# Patient Record
Sex: Male | Born: 1940
Health system: Southern US, Community
[De-identification: ages and names within clinical notes are randomized; demographics above are authoritative.]

## PROBLEM LIST (undated history)

## (undated) DIAGNOSIS — I499 Cardiac arrhythmia, unspecified: Secondary | ICD-10-CM

## (undated) DIAGNOSIS — N183 Chronic kidney disease, stage 3 unspecified: Secondary | ICD-10-CM

## (undated) DIAGNOSIS — R609 Edema, unspecified: Secondary | ICD-10-CM

## (undated) DIAGNOSIS — M170 Bilateral primary osteoarthritis of knee: Secondary | ICD-10-CM

## (undated) DIAGNOSIS — K5792 Diverticulitis of intestine, part unspecified, without perforation or abscess without bleeding: Secondary | ICD-10-CM

## (undated) DIAGNOSIS — E7800 Pure hypercholesterolemia, unspecified: Secondary | ICD-10-CM

## (undated) DIAGNOSIS — M1990 Unspecified osteoarthritis, unspecified site: Secondary | ICD-10-CM

## (undated) DIAGNOSIS — E291 Testicular hypofunction: Secondary | ICD-10-CM

## (undated) DIAGNOSIS — M109 Gout, unspecified: Secondary | ICD-10-CM

## (undated) DIAGNOSIS — R600 Localized edema: Secondary | ICD-10-CM

## (undated) DIAGNOSIS — I77810 Thoracic aortic ectasia: Secondary | ICD-10-CM

## (undated) DIAGNOSIS — E119 Type 2 diabetes mellitus without complications: Secondary | ICD-10-CM

## (undated) HISTORY — DX: Thoracic aortic ectasia: I77.810

## (undated) HISTORY — DX: Type 2 diabetes mellitus without complications: E11.9

## (undated) HISTORY — DX: Cardiac arrhythmia, unspecified: I49.9

## (undated) HISTORY — PX: KNEE ARTHROSCOPY: SUR90

## (undated) HISTORY — DX: Bilateral primary osteoarthritis of knee: M17.0

## (undated) HISTORY — DX: Chronic kidney disease, stage 3 unspecified: N18.30

## (undated) HISTORY — DX: Unspecified osteoarthritis, unspecified site: M19.90

## (undated) HISTORY — DX: Testicular hypofunction: E29.1

## (undated) HISTORY — PX: REPLACEMENT TOTAL KNEE: SUR1224

## (undated) HISTORY — DX: Pure hypercholesterolemia, unspecified: E78.00

---

## 2000-08-21 ENCOUNTER — Encounter: Payer: Self-pay | Admitting: Internal Medicine

## 2000-08-21 ENCOUNTER — Ambulatory Visit (HOSPITAL_COMMUNITY): Admission: RE | Admit: 2000-08-21 | Discharge: 2000-08-21 | Payer: Self-pay | Admitting: Internal Medicine

## 2004-10-10 ENCOUNTER — Ambulatory Visit (HOSPITAL_COMMUNITY): Admission: RE | Admit: 2004-10-10 | Discharge: 2004-10-10 | Payer: Self-pay | Admitting: Internal Medicine

## 2005-12-08 ENCOUNTER — Emergency Department (HOSPITAL_COMMUNITY): Admission: EM | Admit: 2005-12-08 | Discharge: 2005-12-08 | Payer: Self-pay | Admitting: Emergency Medicine

## 2006-01-22 ENCOUNTER — Ambulatory Visit (HOSPITAL_COMMUNITY): Admission: RE | Admit: 2006-01-22 | Discharge: 2006-01-22 | Payer: Self-pay | Admitting: Internal Medicine

## 2010-07-07 ENCOUNTER — Emergency Department (HOSPITAL_COMMUNITY)
Admission: EM | Admit: 2010-07-07 | Discharge: 2010-07-07 | Disposition: A | Discharge status: Home / Self Care | Payer: Medicare Other | Attending: Emergency Medicine | Admitting: Emergency Medicine

## 2010-07-07 ENCOUNTER — Emergency Department (HOSPITAL_COMMUNITY): Payer: Medicare Other

## 2010-07-07 DIAGNOSIS — R112 Nausea with vomiting, unspecified: Secondary | ICD-10-CM | POA: Insufficient documentation

## 2010-07-07 DIAGNOSIS — R109 Unspecified abdominal pain: Secondary | ICD-10-CM | POA: Insufficient documentation

## 2010-07-07 DIAGNOSIS — K5732 Diverticulitis of large intestine without perforation or abscess without bleeding: Secondary | ICD-10-CM | POA: Insufficient documentation

## 2010-07-07 DIAGNOSIS — M109 Gout, unspecified: Secondary | ICD-10-CM | POA: Insufficient documentation

## 2010-07-07 DIAGNOSIS — E7800 Pure hypercholesterolemia, unspecified: Secondary | ICD-10-CM | POA: Insufficient documentation

## 2010-07-07 LAB — DIFFERENTIAL
Basophils Absolute: 0 10*3/uL (ref 0.0–0.1)
Basophils Relative: 0 % (ref 0–1)
Eosinophils Absolute: 0 10*3/uL (ref 0.0–0.7)
Eosinophils Relative: 0 % (ref 0–5)
Lymphocytes Relative: 9 % — ABNORMAL LOW (ref 12–46)
Lymphs Abs: 1.4 10*3/uL (ref 0.7–4.0)
Monocytes Absolute: 1.5 10*3/uL — ABNORMAL HIGH (ref 0.1–1.0)
Monocytes Relative: 10 % (ref 3–12)
Neutro Abs: 11.9 10*3/uL — ABNORMAL HIGH (ref 1.7–7.7)
Neutrophils Relative %: 80 % — ABNORMAL HIGH (ref 43–77)

## 2010-07-07 LAB — CBC
HCT: 46.8 % (ref 39.0–52.0)
Hemoglobin: 15.9 g/dL (ref 13.0–17.0)
MCH: 29.9 pg (ref 26.0–34.0)
MCHC: 34 g/dL (ref 30.0–36.0)
MCV: 88.1 fL (ref 78.0–100.0)
Platelets: 173 10*3/uL (ref 150–400)
RBC: 5.31 MIL/uL (ref 4.22–5.81)
RDW: 14.3 % (ref 11.5–15.5)
WBC: 14.8 10*3/uL — ABNORMAL HIGH (ref 4.0–10.5)

## 2010-07-07 LAB — COMPREHENSIVE METABOLIC PANEL
AST: 18 U/L (ref 0–37)
BUN: 14 mg/dL (ref 6–23)
CO2: 25 mEq/L (ref 19–32)
Calcium: 9.3 mg/dL (ref 8.4–10.5)
Chloride: 104 mEq/L (ref 96–112)
Creatinine, Ser: 1.08 mg/dL (ref 0.4–1.5)
GFR calc Af Amer: >60 mL/min (ref >60)
GFR calc non Af Amer: >60 mL/min (ref >60)
Glucose, Bld: 123 mg/dL — ABNORMAL HIGH (ref 70–99)
Total Bilirubin: 1 mg/dL (ref 0.3–1.2)

## 2010-07-07 LAB — URINALYSIS, ROUTINE W REFLEX MICROSCOPIC
Bilirubin Urine: NEGATIVE
Ketones, ur: NEGATIVE mg/dL
Nitrite: NEGATIVE
Protein, ur: NEGATIVE mg/dL
Specific Gravity, Urine: 1.028 (ref 1.005–1.030)
Urobilinogen, UA: 0.2 mg/dL (ref 0.0–1.0)

## 2010-08-29 ENCOUNTER — Ambulatory Visit (HOSPITAL_COMMUNITY)
Admission: RE | Admit: 2010-08-29 | Discharge: 2010-08-29 | Disposition: A | Discharge status: Home / Self Care | Payer: Medicare Other | Source: Ambulatory Visit | Attending: Internal Medicine | Admitting: Internal Medicine

## 2010-08-29 DIAGNOSIS — M7989 Other specified soft tissue disorders: Secondary | ICD-10-CM | POA: Insufficient documentation

## 2010-08-29 DIAGNOSIS — M79609 Pain in unspecified limb: Secondary | ICD-10-CM | POA: Insufficient documentation

## 2012-03-28 ENCOUNTER — Ambulatory Visit (INDEPENDENT_AMBULATORY_CARE_PROVIDER_SITE_OTHER): Payer: Medicare Other | Admitting: Family Medicine

## 2012-03-28 VITALS — BP 150/79 | HR 81 | Temp 98.4°F | Resp 16 | Ht 66.0 in | Wt 226.6 lb

## 2012-03-28 DIAGNOSIS — J40 Bronchitis, not specified as acute or chronic: Secondary | ICD-10-CM

## 2012-03-28 DIAGNOSIS — R05 Cough: Secondary | ICD-10-CM

## 2012-03-28 MED ORDER — BENZONATATE 100 MG PO CAPS: 100.0000 mg | ORAL_CAPSULE | Freq: Three times a day (TID) | ORAL | Status: DC | PRN

## 2012-03-28 MED ORDER — AZITHROMYCIN 250 MG PO TABS: ORAL_TABLET | ORAL | Status: DC

## 2012-03-28 NOTE — Progress Notes (Signed)
Subjective:    Patient ID: Shawn [name], male    DOB: [birth year], 71 y.o.   MRN: [id number]  HPI Shawn [name] is a 71 y.o. male Cold symptoms around thanksgiving.  Persistent cough since then - past 2weeks.  No improvement in cough. Minimal phlegm with cough - yellow-clear at times. No underlying lung disease. Usually able to sleep through the night.   Tx: otc cough syrup.    Review of Systems  Constitutional: Negative for fever and chills.  HENT: Positive for congestion (on right. ).   Respiratory: Positive for cough. Negative for chest tightness and shortness of breath.   Cardiovascular: Negative for chest pain.       Objective:   Physical Exam  Nursing note and vitals reviewed. Constitutional: He is oriented to person, place, and time. He appears well-developed and well-nourished.  HENT:  Head: Normocephalic and atraumatic.  Right Ear: Tympanic membrane, external ear and ear canal normal.  Left Ear: Tympanic membrane, external ear and ear canal normal.  Nose: No rhinorrhea.  Mouth/Throat: Oropharynx is clear and moist and mucous membranes are normal. No oropharyngeal exudate or posterior oropharyngeal erythema.  Eyes: Conjunctivae normal are normal. Pupils are equal, round, and reactive to light.  Neck: Neck supple.  Cardiovascular: Normal rate, regular rhythm, normal heart sounds and intact distal pulses.   No murmur heard. Pulmonary/Chest: Effort normal and breath sounds normal. He has no wheezes. He has no rhonchi. He has no rales.       Clear, normal effort, few dry coughs in office.   Abdominal: Soft. There is no tenderness.  Lymphadenopathy:    He has no cervical adenopathy.  Neurological: He is alert and oriented to person, place, and time.  Skin: Skin is warm and dry. No rash noted.  Psychiatric: He has a normal mood and affect. His behavior is normal.      Assessment & Plan:  Shawn [name] is a 71 y.o. male 1. Cough    2. Bronchitis  benzonatate  (TESSALON) 100 MG capsule, azithromycin (ZITHROMAX) 250 MG tablet   Early bronchitis - reassuring exam.  Deferred bloodwork and xray at this point, but rtc precautions discussed. Sx care, z pak, tessalon prn. Watch BP and rtc if remains over 140/90 outside of office.   Patient Instructions  You likely have an early bronchitis.  Start the prescribed antibiotic, mucinex dm OR tessalon perles for the cough as prescribed and other information below. If not improving in next week, or cough lasts longer than 3 weeks - return for recheck. Your blood pressure was borderline high today. Keep a record of your blood pressures outside of the office and bring them to the next office visit - in next few weeks if remaining over 140/90.   Return to the clinic or go to the nearest emergency room if any of your symptoms worsen or new symptoms occur.  Bronchitis Bronchitis is the body's way of reacting to injury and/or infection (inflammation) of the bronchi. Bronchi are the air tubes that extend from the windpipe into the lungs. If the inflammation becomes severe, it may cause shortness of breath. CAUSES  Inflammation may be caused by:  A virus.  Germs (bacteria).  Dust.  Allergens.  Pollutants and many other irritants. The cells lining the bronchial tree are covered with tiny hairs (cilia). These constantly beat upward, away from the lungs, toward the mouth. This keeps the lungs free of pollutants. When these cells become too irritated and are unable to do their  job, mucus begins to develop. This causes the characteristic cough of bronchitis. The cough clears the lungs when the cilia are unable to do their job. Without either of these protective mechanisms, the mucus would settle in the lungs. Then you would develop pneumonia. Smoking is a common cause of bronchitis and can contribute to pneumonia. Stopping this habit is the single most important thing you can do to help yourself. TREATMENT   Your  caregiver may prescribe an antibiotic if the cough is caused by bacteria. Also, medicines that open up your airways make it easier to breathe. Your caregiver may also recommend or prescribe an expectorant. It will loosen the mucus to be coughed up. Only take over-the-counter or prescription medicines for pain, discomfort, or fever as directed by your caregiver.  Removing whatever causes the problem (smoking, for example) is critical to preventing the problem from getting worse.  Cough suppressants may be prescribed for relief of cough symptoms.  Inhaled medicines may be prescribed to help with symptoms now and to help prevent problems from returning.  For those with recurrent (chronic) bronchitis, there may be a need for steroid medicines. SEEK IMMEDIATE MEDICAL CARE IF:   During treatment, you develop more pus-like mucus (purulent sputum).  You have a fever.  Your baby is older than 3 months with a rectal temperature of 102 F (38.9 C) or higher.  Your baby is 57 months old or younger with a rectal temperature of 100.4 F (38 C) or higher.  You become progressively more ill.  You have increased difficulty breathing, wheezing, or shortness of breath. It is necessary to seek immediate medical care if you are elderly or sick from any other disease. MAKE SURE YOU:   Understand these instructions.  Will watch your condition.  Will get help right away if you are not doing well or get worse. Document Released: 04/03/2005 Document Revised: 06/26/2011 Document Reviewed: 02/11/2008 [facility] Patient Information 2013 [place], [place].

## 2012-03-28 NOTE — Patient Instructions (Signed)
You likely have an early bronchitis.  Start the prescribed antibiotic, mucinex dm OR tessalon perles for the cough as prescribed and other information below. If not improving in next week, or cough lasts longer than 3 weeks - return for recheck. Your blood pressure was borderline high today. Keep a record of your blood pressures outside of the office and bring them to the next office visit - in next few weeks if remaining over 140/90.   Return to the clinic or go to the nearest emergency room if any of your symptoms worsen or new symptoms occur.  Bronchitis Bronchitis is the body's way of reacting to injury and/or infection (inflammation) of the bronchi. Bronchi are the air tubes that extend from the windpipe into the lungs. If the inflammation becomes severe, it may cause shortness of breath. CAUSES  Inflammation may be caused by:  A virus.  Germs (bacteria).  Dust.  Allergens.  Pollutants and many other irritants. The cells lining the bronchial tree are covered with tiny hairs (cilia). These constantly beat upward, away from the lungs, toward the mouth. This keeps the lungs free of pollutants. When these cells become too irritated and are unable to do their job, mucus begins to develop. This causes the characteristic cough of bronchitis. The cough clears the lungs when the cilia are unable to do their job. Without either of these protective mechanisms, the mucus would settle in the lungs. Then you would develop pneumonia. Smoking is a common cause of bronchitis and can contribute to pneumonia. Stopping this habit is the single most important thing you can do to help yourself. TREATMENT   Your caregiver may prescribe an antibiotic if the cough is caused by bacteria. Also, medicines that open up your airways make it easier to breathe. Your caregiver may also recommend or prescribe an expectorant. It will loosen the mucus to be coughed up. Only take over-the-counter or prescription medicines  for pain, discomfort, or fever as directed by your caregiver.  Removing whatever causes the problem (smoking, for example) is critical to preventing the problem from getting worse.  Cough suppressants may be prescribed for relief of cough symptoms.  Inhaled medicines may be prescribed to help with symptoms now and to help prevent problems from returning.  For those with recurrent (chronic) bronchitis, there may be a need for steroid medicines. SEEK IMMEDIATE MEDICAL CARE IF:   During treatment, you develop more pus-like mucus (purulent sputum).  You have a fever.  Your baby is older than 3 months with a rectal temperature of 102 F (38.9 C) or higher.  Your baby is 20 months old or younger with a rectal temperature of 100.4 F (38 C) or higher.  You become progressively more ill.  You have increased difficulty breathing, wheezing, or shortness of breath. It is necessary to seek immediate medical care if you are elderly or sick from any other disease. MAKE SURE YOU:   Understand these instructions.  Will watch your condition.  Will get help right away if you are not doing well or get worse. Document Released: 04/03/2005 Document Revised: 06/26/2011 Document Reviewed: 02/11/2008 [facility] Patient Information 2013 [place], [place].

## 2012-09-26 ENCOUNTER — Emergency Department (HOSPITAL_COMMUNITY): Payer: Medicare Other

## 2012-09-26 ENCOUNTER — Encounter (HOSPITAL_COMMUNITY): Payer: Self-pay | Admitting: *Deleted

## 2012-09-26 ENCOUNTER — Emergency Department (HOSPITAL_COMMUNITY)
Admission: EM | Admit: 2012-09-26 | Discharge: 2012-09-26 | Disposition: A | Discharge status: Home / Self Care | Payer: Medicare Other | Attending: Emergency Medicine | Admitting: Emergency Medicine

## 2012-09-26 ENCOUNTER — Emergency Department (HOSPITAL_COMMUNITY)
Admission: EM | Admit: 2012-09-26 | Discharge: 2012-09-26 | Disposition: A | Discharge status: Other Acute Inpatient Hospital | Payer: Medicare Other | Source: Home / Self Care | Attending: Emergency Medicine | Admitting: Emergency Medicine

## 2012-09-26 DIAGNOSIS — R197 Diarrhea, unspecified: Secondary | ICD-10-CM | POA: Insufficient documentation

## 2012-09-26 DIAGNOSIS — E669 Obesity, unspecified: Secondary | ICD-10-CM | POA: Insufficient documentation

## 2012-09-26 DIAGNOSIS — M109 Gout, unspecified: Secondary | ICD-10-CM | POA: Insufficient documentation

## 2012-09-26 DIAGNOSIS — Z8739 Personal history of other diseases of the musculoskeletal system and connective tissue: Secondary | ICD-10-CM | POA: Insufficient documentation

## 2012-09-26 DIAGNOSIS — K5792 Diverticulitis of intestine, part unspecified, without perforation or abscess without bleeding: Secondary | ICD-10-CM

## 2012-09-26 DIAGNOSIS — Z79899 Other long term (current) drug therapy: Secondary | ICD-10-CM | POA: Insufficient documentation

## 2012-09-26 DIAGNOSIS — E785 Hyperlipidemia, unspecified: Secondary | ICD-10-CM | POA: Insufficient documentation

## 2012-09-26 DIAGNOSIS — K5732 Diverticulitis of large intestine without perforation or abscess without bleeding: Secondary | ICD-10-CM

## 2012-09-26 HISTORY — DX: Diverticulitis of intestine, part unspecified, without perforation or abscess without bleeding: K57.92

## 2012-09-26 LAB — CBC WITH DIFFERENTIAL/PLATELET
Basophils Absolute: 0.1 10*3/uL (ref 0.0–0.1)
HCT: 44.7 % (ref 39.0–52.0)
Lymphocytes Relative: 17 % (ref 12–46)
Lymphs Abs: 2.4 10*3/uL (ref 0.7–4.0)
MCV: 87.6 fL (ref 78.0–100.0)
Monocytes Absolute: 1.2 10*3/uL — ABNORMAL HIGH (ref 0.1–1.0)
Neutro Abs: 10.4 10*3/uL — ABNORMAL HIGH (ref 1.7–7.7)
Platelets: 179 10*3/uL (ref 150–400)
RBC: 5.1 MIL/uL (ref 4.22–5.81)
RDW: 15.2 % (ref 11.5–15.5)
WBC: 14.1 10*3/uL — ABNORMAL HIGH (ref 4.0–10.5)

## 2012-09-26 LAB — COMPREHENSIVE METABOLIC PANEL
ALT: 11 U/L (ref 0–53)
AST: 15 U/L (ref 0–37)
CO2: 28 mEq/L (ref 19–32)
Chloride: 98 mEq/L (ref 96–112)
GFR calc Af Amer: 60 mL/min — ABNORMAL LOW (ref >90)
GFR calc non Af Amer: 52 mL/min — ABNORMAL LOW (ref >90)
Glucose, Bld: 106 mg/dL — ABNORMAL HIGH (ref 70–99)
Sodium: 137 mEq/L (ref 135–145)
Total Bilirubin: 1 mg/dL (ref 0.3–1.2)

## 2012-09-26 LAB — POCT URINALYSIS DIP (DEVICE)
Glucose, UA: NEGATIVE mg/dL
Ketones, ur: NEGATIVE mg/dL
Leukocytes, UA: NEGATIVE
Protein, ur: 30 mg/dL — AB
Specific Gravity, Urine: >=1.03 (ref 1.005–1.030)

## 2012-09-26 MED ORDER — IOHEXOL 300 MG/ML  SOLN
100.0000 mL | Freq: Once | INTRAMUSCULAR | Status: AC | PRN
  Administered 2012-09-26: 100 mL via INTRAVENOUS

## 2012-09-26 MED ORDER — MORPHINE SULFATE 4 MG/ML IJ SOLN
4.0000 mg | Freq: Once | INTRAMUSCULAR | Status: AC
  Administered 2012-09-26: 4 mg via INTRAVENOUS
  Filled 2012-09-26: qty 1

## 2012-09-26 MED ORDER — METRONIDAZOLE 500 MG PO TABS
500.0000 mg | ORAL_TABLET | Freq: Once | ORAL | Status: AC
  Administered 2012-09-26: 500 mg via ORAL
  Filled 2012-09-26: qty 1

## 2012-09-26 MED ORDER — ONDANSETRON HCL 4 MG/2ML IJ SOLN
4.0000 mg | Freq: Once | INTRAMUSCULAR | Status: AC
  Administered 2012-09-26: 4 mg via INTRAMUSCULAR

## 2012-09-26 MED ORDER — HYDROMORPHONE HCL PF 1 MG/ML IJ SOLN
1.0000 mg | Freq: Once | INTRAMUSCULAR | Status: AC
  Administered 2012-09-26: 1 mg via INTRAMUSCULAR

## 2012-09-26 MED ORDER — METRONIDAZOLE 500 MG PO TABS: 500.0000 mg | ORAL_TABLET | Freq: Three times a day (TID) | ORAL | Status: DC

## 2012-09-26 MED ORDER — CIPROFLOXACIN IN D5W 400 MG/200ML IV SOLN
400.0000 mg | Freq: Once | INTRAVENOUS | Status: AC
  Administered 2012-09-26: 400 mg via INTRAVENOUS
  Filled 2012-09-26: qty 200

## 2012-09-26 MED ORDER — ONDANSETRON HCL 4 MG/2ML IJ SOLN
INTRAMUSCULAR | Status: AC
  Filled 2012-09-26: qty 2

## 2012-09-26 MED ORDER — IOHEXOL 300 MG/ML  SOLN
25.0000 mL | INTRAMUSCULAR | Status: AC
  Administered 2012-09-26: 25 mL via ORAL

## 2012-09-26 MED ORDER — CIPROFLOXACIN HCL 500 MG PO TABS: 500.0000 mg | ORAL_TABLET | Freq: Two times a day (BID) | ORAL | Status: DC

## 2012-09-26 MED ORDER — HYDROMORPHONE HCL PF 1 MG/ML IJ SOLN
INTRAMUSCULAR | Status: AC
  Filled 2012-09-26: qty 1

## 2012-09-26 MED ORDER — HYDROCODONE-ACETAMINOPHEN 5-325 MG PO TABS: 1.0000 | ORAL_TABLET | ORAL | Status: DC | PRN

## 2012-09-26 NOTE — ED Notes (Signed)
Durene Cal, MD at bedside

## 2012-09-26 NOTE — ED Provider Notes (Signed)
Chief Complaint:   Chief Complaint  Patient presents with  . Abdominal Pain    History of Present Illness:    Shawn [name] is a 72 year old male who has had a two-day history of increasingly severe left lower quadrant pain without radiation and is rated a 9/10 at the worst and then sometimes it subsides to a 6/10. Nothing makes it better or worse. It's been associated with a few loose stools without blood or mucus, fever, chills, sweats, and nausea. He denies any vomiting or urinary symptoms. There's been no blood in the urine or stool. He has a history of diverticulitis in the past.  Review of Systems:  Other than noted above, the patient denies any of the following symptoms: Constitutional:  No fever, chills, fatigue, weight loss or anorexia. Lungs:  No cough or shortness of breath. Heart:  No chest pain, palpitations, syncope or edema.  No cardiac history. Abdomen:  No nausea, vomiting, hematememesis, melena, diarrhea, or hematochezia. GU:  No dysuria, frequency, urgency, or hematuria.  No testicular pain or swelling.  PMFSH:  Past medical history, family history, social history, meds, and allergies were reviewed along with nurse's notes.  No prior abdominal surgeries or history of GI problems.  No use of NSAIDs or aspirin.  No excessive  alcohol intake. He takes allopurinol, AndroGel, and Crestor. He has a history of gout and elevated cholesterol.  Physical Exam:   Vital signs:  BP 160/80  Pulse 78  Temp(Src) 101.4 F (38.6 C) (Oral)  Resp 18  SpO2 98% Gen:  Alert, oriented, in no distress. Lungs:  Breath sounds clear and equal bilaterally.  No wheezes, rales or rhonchi. Heart:  Regular rhythm.  No gallops or murmers.   Abdomen:  Soft and mildly distended. Bowel sounds are hyperactive. There is tenderness with guarding and rebound in the left lower quadrant. No tenderness elsewhere in the abdomen. No organomegaly or mass. Skin:  Clear, warm and dry.  No rash.  Labs:   Results  for orders placed during the hospital encounter of 09/26/12  POCT URINALYSIS DIP (DEVICE)      Result Value Range   Glucose, UA NEGATIVE  NEGATIVE mg/dL   Bilirubin Urine NEGATIVE  NEGATIVE   Ketones, ur NEGATIVE  NEGATIVE mg/dL   Specific Gravity, Urine >=1.030  1.005 - 1.030   Hgb urine dipstick MODERATE (*) NEGATIVE   pH 5.5  5.0 - 8.0   Protein, ur 30 (*) NEGATIVE mg/dL   Urobilinogen, UA 0.2  0.0 - 1.0 mg/dL   Nitrite NEGATIVE  NEGATIVE   Leukocytes, UA NEGATIVE  NEGATIVE    Course in Urgent Care Center:   Given Dilaudid 1 mg IM and Zofran 4 mg IM.  Assessment:  The encounter diagnosis was Diverticulitis.  He will need further evaluation including a CT scan and if he does have diverticulitis will need his first dose of antibiotics intravenously.  Plan:   1.  The following meds were prescribed:   New Prescriptions   No medications on file   2.  The patient was transferred to the emergency department in stable condition via shuttle.  Medical Decision Making:  72 year old male has 2 day history of LLQ pain, nausea, loose stools, fever.  No vomiting or urinary Sx.  Has a Hx of diverticulitis.  On exam he is tender w/ guarding in LLQ.  BS are active.  I think he has diverticulitis--needs CT scan and perhaps first dose ov IV antibiotics. We will give IM  Dilaudid 1 mg and Zofran 4 mg.      Reuben Likes, MD 09/26/12 [id number]

## 2012-09-26 NOTE — ED Provider Notes (Signed)
History     CSN: [id number] Arrival date & time 09/26/12  1457 First MD Initiated Contact with Patient 09/26/12 1727      Chief Complaint  Patient presents with  . Abdominal Pain   Patient is a 72 y.o. male presenting with abdominal pain.  Abdominal Pain Associated symptoms include abdominal pain.    72 year old male with history of diverticulitis presenting with LLQ pain.   Patient describes pain starting yesterday morning in LLQ crampy in nature. No radiation of pain. Pain fluctuates but goes up to 8-9/10 at peak but never better than 6/10. No nausea/vomiting/fever/chills (before coming to urgent care and was found to have temp to 101.3 there). Associated symptoms include 1-2 loose stools daily over the last 2 days. Denies blood in stool or melena. No dysuria or polyuria. Patient has been able to tolerate PO. Denies any worsening or alleviating factors. Denies weakness in legs or numbness tingling. Dneies back pain.   At urgent care, high concern for divericulitis so sent here for CT scan. Given 1mg  of dilaudid as well as Zofran before transport.   Past Medical History  Diagnosis Date  . Arthritis   . Diverticulitis   Gout Hyperlipidemia  History reviewed. No pertinent past surgical history.  Family History  Problem Relation Age of Onset  . Cancer Father     History  Substance Use Topics  . Smoking status: Never Smoker   . Smokeless tobacco: Not on file  . Alcohol Use: Yes    Review of Systems  Gastrointestinal: Positive for abdominal pain.   A full 10 point review of symptoms was performed and was negative except as noted in HPI.   Allergies  Review of patient's allergies indicates no known allergies.  Home Medications   Current Outpatient Rx  Name  Route  Sig  Dispense  Refill  . allopurinol (ZYLOPRIM) 300 MG tablet   Oral   Take 150 mg by mouth daily.          . fish oil-omega-3 fatty acids 1000 MG capsule   Oral   Take 1 g by mouth daily.          . Glucosamine HCl (GLUCOSAMINE PO)   Oral   Take 1 tablet by mouth daily.         . Multiple Vitamin (MULTIVITAMIN WITH MINERALS) TABS   Oral   Take 1 tablet by mouth daily.         . rosuvastatin (CRESTOR) 10 MG tablet   Oral   Take 10 mg by mouth every morning.          . testosterone (ANDROGEL) 50 MG/5GM GEL   Transdermal   Place 5 g onto the skin daily. Applies to shoulders and upper arms           BP 126/73  Pulse 87  Temp(Src) 99 F (37.2 C) (Oral)  Resp 16  SpO2 94%  Physical Exam  Constitutional: He is oriented to person, place, and time. He appears well-developed and well-nourished. No distress.  obese  HENT:  Head: Normocephalic and atraumatic.  Eyes: EOM are normal. Pupils are equal, round, and reactive to light.  Neck: Normal range of motion. Neck supple.  Cardiovascular: Normal rate and regular rhythm.  Exam reveals no gallop and no friction rub.   No murmur heard. Pulmonary/Chest: Effort normal and breath sounds normal. He has no wheezes. He has no rales.  Abdominal: Soft. Bowel sounds are normal. He exhibits no mass. There  is tenderness (moderate in LLQ (after dilaudid) ). There is no rebound and no guarding.  Musculoskeletal: Normal range of motion. He exhibits no edema.  Neurological: He is alert and oriented to person, place, and time.  Skin: Skin is warm and dry.  Psychiatric: He has a normal mood and affect. His behavior is normal.    ED Course  Procedures (including critical care time)  Labs Reviewed  CBC WITH DIFFERENTIAL - Abnormal; Notable for the following:    WBC 14.1 (*)    Neutro Abs 10.4 (*)    Monocytes Absolute 1.2 (*)    All other components within normal limits  COMPREHENSIVE METABOLIC PANEL - Abnormal; Notable for the following:    Glucose, Bld 106 (*)    GFR calc non Af Amer 52 (*)    GFR calc Af Amer 60 (*)    All other components within normal limits  LIPASE, BLOOD - Abnormal; Notable for the following:    Lipase  157 (*)    All other components within normal limits   Ct Abdomen Pelvis W Contrast  09/26/2012   *RADIOLOGY REPORT*  Clinical Data: Right lower quadrant abdominal pain, nausea and diarrhea.  History of diverticulitis.  CT ABDOMEN AND PELVIS WITH CONTRAST  Technique:  Multidetector CT imaging of the abdomen and pelvis was performed following the standard protocol during bolus administration of intravenous contrast.  Contrast: OMNIPAQUE IOHEXOL 300 MG/ML  SOLN  Comparison: 07/07/2010.  Findings: Large number of colonic diverticula, most numerous in the sigmoid region.  Significant eccentric sigmoid colon low density wall thickening with adjacent pericolonic soft tissue stranding and edema.  Normal appendix.  No small bowel abnormalities and no fluid collections or free peritoneal air.  Diffuse low density of the liver relative to the spleen.  Normal appearing spleen, pancreas, gallbladder, adrenal glands, kidneys and urinary bladder.  Moderately enlarged prostate gland.  Small bilateral inguinal hernias containing fat.  No enlarged lymph nodes.  Mildly increased linear density at both lung bases.  Lumbar lower thoracic spine degenerative changes.  IMPRESSION:  1.  Sigmoid diverticulitis without abscess. 2.  Extensive colonic diverticulosis. 3.  Diffuse hepatic steatosis. 4.  Moderately enlarged prostate gland. 5.  Small bilateral inguinal hernias containing fat. 6.  Bibasilar linear atelectasis and scarring.   Original Report Authenticated By: Beckie Salts, M.D.     1. Diverticulitis       MDM  72 year old male with history of diverticulitis presenting with LLQ pain found to have sigmoid diverticulitis on CT scan and WBC 14k. Otherwise, patient nontoxic appearing and pain moderately well controlled after dilaudid.  Patient given IV cipro and po flagyl before discharge without difficulty. Given 14 days of antibiotics and instructed to follow up with PCP on Friday. If patient doing well in 10 days,  PCP could shorten duration.   Lipase of 157 noted and will need follow up. Advised cessation of alcohol. Will need repeat testing. No epigastric pain to account for this from pancreatitis perspective and tolerating PO.   Shelva Majestic, MD 09/26/12 2006

## 2012-09-26 NOTE — ED Notes (Signed)
Pt finished oral contrast.  CT notified.

## 2012-09-26 NOTE — ED Notes (Addendum)
Pt in from urgent care for eval for abd pain with fever, r/o diverticulitis, pt states he has been c/o abd pain with with nausea since yesterday, given shot of pain medication at urgent care which reduced pain to 4/10

## 2012-09-26 NOTE — ED Notes (Signed)
Pt  Reports  Low    abd  Pain l  Lower  Quad  Since  yest   Nausea  No  Vomiting              -   History  Of  divericulitis

## 2012-09-27 NOTE — ED Provider Notes (Signed)
I saw and evaluated the patient, reviewed the resident's note and I agree with the findings and plan.   .Face to face Exam:  General:  Awake HEENT:  Atraumatic Resp:  Normal effort Abd:  Nondistended Neuro:No focal weakness   Nelia Shi, MD 09/27/12 1048

## 2014-01-26 ENCOUNTER — Encounter (HOSPITAL_BASED_OUTPATIENT_CLINIC_OR_DEPARTMENT_OTHER): Payer: Medicare Other

## 2014-02-03 ENCOUNTER — Ambulatory Visit (HOSPITAL_BASED_OUTPATIENT_CLINIC_OR_DEPARTMENT_OTHER): Payer: Medicare Other | Attending: Internal Medicine | Admitting: Radiology

## 2014-02-03 VITALS — Ht 66.0 in | Wt 225.0 lb

## 2014-02-03 DIAGNOSIS — G4733 Obstructive sleep apnea (adult) (pediatric): Secondary | ICD-10-CM | POA: Insufficient documentation

## 2014-02-21 DIAGNOSIS — G4733 Obstructive sleep apnea (adult) (pediatric): Secondary | ICD-10-CM

## 2014-02-21 NOTE — Sleep Study (Signed)
   NAME: Shawn CrockerRichard Economos DATE OF BIRTH:  February 12, 1941 MEDICAL RECORD NUMBER [id number]  LOCATION: [facility]  PHYSICIAN: YOUNG,CLINTON D  DATE OF STUDY: 02/03/2014  SLEEP STUDY TYPE: Nocturnal Polysomnogram               REFERRING PHYSICIAN: Laurena Slimmerlark, Preston S, MD  INDICATION FOR STUDY: Insomnia with sleep apnea  EPWORTH SLEEPINESS SCORE:  15/ 24 HEIGHT: 5\' 6"  (167.6 cm)  WEIGHT: 225 lb (102.059 kg)    Body mass index is 36.33 kg/(m^2).  NECK SIZE: 15.5 in.  MEDICATIONS: Charted for review   SLEEP ARCHITECTURE: Total sleep time 340 min, sleep efficiency 91.2%, Stage 1 9.6%, Stage 2 64.6%, Stage 3 absent, REM 25.9% of total sleep time. Sleep latency 8.0 min, REM latency 55.5 min, Awake after sleep onset 25.805min/ arousal index 17.5, Bedtime medication: none  RESPIRATORY DATA: Apnea/ Hypopnea Index (AHI) 15.0/ hr. 85 total events scored, 8 obstructive apneas, 77 hypopneas. All events were non-supine. This study was ordered as a diagnostic polysomnogram (NPSG) without CPAP.  OXYGEN DATA: Moderately loud snoring with O2 desaturation to a nadir of 86% and mean O2 saturation 93.1% on room air.  CARDIAC DATA: Sinus rhythm with PACs and PVCs.  MOVEMENT/PARASOMNIA: A few incidental limb jerks were noted with little associated sleep disturbance. No bathroom trips  IMPRESSION/ RECOMMENDATION:   1) Mild to moderate obstructive sleep apnea/ hypopnea syndrome, AHI 15.0/ hr with non-supine events. Moderately loud snoring with oxygen desaturation to a nadir of 86% and mean O2 saturation of 93.1% on room air. 2) CPAP titration was not requested with this study. The patient can return for a dedicated CPAP titration study if appropriate.   Waymon BudgeYOUNG,CLINTON D Diplomate, American Board of Sleep Medicine  ELECTRONICALLY SIGNED ON:  02/21/2014, 9:59 AM [facility] PH: [phone]   FX: [phone] ACCREDITED BY THE AMERICAN ACADEMY OF SLEEP MEDICINE

## 2014-04-28 ENCOUNTER — Encounter: Payer: Self-pay | Admitting: Pulmonary Disease

## 2014-04-28 ENCOUNTER — Ambulatory Visit (INDEPENDENT_AMBULATORY_CARE_PROVIDER_SITE_OTHER): Payer: Medicare Other | Admitting: Pulmonary Disease

## 2014-04-28 VITALS — BP 124/76 | HR 87 | Temp 98.1°F | Ht 66.0 in | Wt 229.0 lb

## 2014-04-28 DIAGNOSIS — G4733 Obstructive sleep apnea (adult) (pediatric): Secondary | ICD-10-CM

## 2014-04-28 NOTE — Assessment & Plan Note (Signed)
The patient has mild obstructive sleep apnea by his recent sleep study, and I have reviewed the pathophysiology of sleep disordered breathing with him.  The good news here is that his degree of sleep apnea is not significant enough to have a big impact on his cardiovascular health, but he understands this can worsen over time if he does not lose weight. I have outlined a conservative treatment approach with a trial of weight loss over the next 6 months to see if he can make headway. He can also consider a dental appliance or C Pap while trying to work on weight loss. His decision should be based upon the impact of his sleep disordered breathing to he and his wife's sleep. After a long discussion, the patient would like to try a dental appliance while working on weight loss. I have offered to refer him to an orthodontist here in town who does appliances as a specialty, but he would like to discuss further with his dentist. He understands that he is to call me if he changes his mind and wishes to try C Pap.

## 2014-04-28 NOTE — Progress Notes (Signed)
Subjective:    Patient ID: Shawn [name], male    DOB: [birth year], 74 y.o.   MRN: [id number]  HPI The patient is a 74 year old male who I've been asked to see for management of obstructive sleep apnea. He has had a recent sleep study that showed an AHI of 15 events per hour. He has been noted to have significant snoring, but his bed partner has never commented on an abnormal breathing pattern during sleep. He does have frequent awakenings during the night, but feels that he is rested 75% of the mornings. He does note occasional sleepiness during the day with reading, but otherwise feels that he has a very good alertness level. He also has some dozing in the evenings watching television or movies. He has mild sleepiness driving longer distances, but otherwise has no issues. He does report that his wife did note him dozing at a stoplight on one occasion a while back. The patient states that his weight is up about 5 pounds over the last 2 years, and his Epworth score today is 8. He tells me that both his father and brother have C Pap devices.   Sleep Questionnaire What time do you typically go to bed?( Between what hours) 11:30P 11:30P at 1451 on 04/28/14 by Tommie SamsMindy S Silva, CMA How long does it take you to fall asleep? 15-20 min 15-20 min at 1451 on 04/28/14 by Tommie SamsMindy S Silva, CMA How many times during the night do you wake up? 2 2 at 1451 on 04/28/14 by Tommie SamsMindy S Silva, CMA What time do you get out of bed to start your day? 0630 0630 at 1451 on 04/28/14 by Tommie SamsMindy S Silva, CMA Do you drive or operate heavy machinery in your occupation? No No at 1451 on 04/28/14 by Tommie SamsMindy S Silva, CMA How much has your weight changed (up or down) over the past two years? (In pounds) 5 lb (2.268 kg) 5 lb (2.268 kg) at 1451 on 04/28/14 by Tommie SamsMindy S Silva, CMA Have you ever had a sleep study before? Yes Yes at 1451 on 04/28/14 by Tommie SamsMindy S Silva, CMA If yes, [facility] at 1451 on 04/28/14 by Tommie SamsMindy  S Silva, CMA If yes, date of study? 01/2014 01/2014 at 1451 on 04/28/14 by Tommie SamsMindy S Silva, CMA Do you currently use CPAP? No No at 1451 on 04/28/14 by Tommie SamsMindy S Silva, CMA Do you wear oxygen at any time? No No at 1451 on 04/28/14 by Tommie SamsMindy S Silva, CMA   Review of Systems  Constitutional: Negative for fever and unexpected weight change.  HENT: Negative for congestion, dental problem, ear pain, nosebleeds, postnasal drip, rhinorrhea, sinus pressure, sneezing, sore throat and trouble swallowing.   Eyes: Negative for redness and itching.  Respiratory: Negative for cough, chest tightness, shortness of breath and wheezing.   Cardiovascular: Negative for palpitations and leg swelling.  Gastrointestinal: Negative for nausea and vomiting.  Genitourinary: Negative for dysuria.  Musculoskeletal: Negative for joint swelling.  Skin: Negative for rash.  Neurological: Negative for headaches.  Hematological: Does not bruise/bleed easily.  Psychiatric/Behavioral: Negative for dysphoric mood. The patient is not nervous/anxious.        Objective:   Physical Exam Constitutional:  Overweight male, no acute distress  HENT:  Nares patent without discharge  Oropharynx without exudate, palate and uvula are mildly elongated  Eyes:  Perrla, eomi, no scleral icterus  Neck:  No JVD, no TMG  Cardiovascular:  Normal rate, regular rhythm, no rubs or gallops.  2/6 sem        Intact distal pulses  Pulmonary :  Normal breath sounds, no stridor or respiratory distress   No rales, rhonchi, or wheezing  Abdominal:  Soft, nondistended, bowel sounds present.  No tenderness noted.   Musculoskeletal:  1+ lower extremity edema noted.  Lymph Nodes:  No cervical lymphadenopathy noted  Skin:  No cyanosis noted  Neurologic:  Alert, appropriate, moves all 4 extremities without obvious deficit.         Assessment & Plan:

## 2014-04-28 NOTE — Patient Instructions (Signed)
Please contact your dentist to see if they fit dental appliances for sleep apnea.  If not, I would be happy to refer you to someone in town who is very good with them.   Work on weight loss If you change your mind and would like to try cpap, let me know.

## 2015-06-02 ENCOUNTER — Other Ambulatory Visit: Payer: Self-pay | Admitting: Internal Medicine

## 2015-06-02 DIAGNOSIS — R109 Unspecified abdominal pain: Secondary | ICD-10-CM

## 2015-06-07 ENCOUNTER — Ambulatory Visit (HOSPITAL_COMMUNITY)
Admission: RE | Admit: 2015-06-07 | Discharge: 2015-06-07 | Disposition: A | Discharge status: Home / Self Care | Payer: Medicare Other | Source: Ambulatory Visit | Attending: Internal Medicine | Admitting: Internal Medicine

## 2015-06-07 DIAGNOSIS — R109 Unspecified abdominal pain: Secondary | ICD-10-CM | POA: Diagnosis present

## 2015-07-27 ENCOUNTER — Other Ambulatory Visit: Payer: Self-pay | Admitting: Emergency Medicine

## 2015-07-27 ENCOUNTER — Ambulatory Visit (INDEPENDENT_AMBULATORY_CARE_PROVIDER_SITE_OTHER): Payer: Medicare Other

## 2015-07-27 ENCOUNTER — Ambulatory Visit (INDEPENDENT_AMBULATORY_CARE_PROVIDER_SITE_OTHER): Payer: Medicare Other | Admitting: Emergency Medicine

## 2015-07-27 VITALS — BP 138/82 | HR 81 | Temp 98.7°F | Resp 16 | Ht 66.0 in | Wt 230.6 lb

## 2015-07-27 DIAGNOSIS — R05 Cough: Secondary | ICD-10-CM

## 2015-07-27 DIAGNOSIS — M7989 Other specified soft tissue disorders: Secondary | ICD-10-CM

## 2015-07-27 DIAGNOSIS — R059 Cough, unspecified: Secondary | ICD-10-CM

## 2015-07-27 LAB — POCT CBC
Granulocyte percent: 60.8 %G (ref 37–80)
HCT, POC: 41.2 % — AB (ref 43.5–53.7)
HEMOGLOBIN: 14.7 g/dL (ref 14.1–18.1)
Lymph, poc: 1.7 (ref 0.6–3.4)
MCH: 30.7 pg (ref 27–31.2)
MCHC: 35.7 g/dL — AB (ref 31.8–35.4)
MCV: 86.1 fL (ref 80–97)
MID (cbc): 0.7 (ref 0–0.9)
MPV: 6.7 fL (ref 0–99.8)
PLATELET COUNT, POC: 193 10*3/uL (ref 142–424)
POC Granulocyte: 3.8 (ref 2–6.9)
POC LYMPH PERCENT: 27.6 %L (ref 10–50)
POC MID %: 11.6 % (ref 0–12)
RBC: 4.79 M/uL (ref 4.69–6.13)
RDW, POC: 15.3 %
WBC: 6.2 10*3/uL (ref 4.6–10.2)

## 2015-07-27 MED ORDER — AZITHROMYCIN 250 MG PO TABS: ORAL_TABLET | ORAL | Status: DC

## 2015-07-27 MED ORDER — BENZONATATE 100 MG PO CAPS: 100.0000 mg | ORAL_CAPSULE | Freq: Three times a day (TID) | ORAL | Status: DC | PRN

## 2015-07-27 NOTE — Patient Instructions (Addendum)
We made appointment for you to have Venous Doppler U/S tomorrow, 07/28/15 @ 3:30pm.  [facility] [address]  [phone]    IF you received an x-ray today, you will receive an invoice from [facility]. Please contact [facility] at [phone] with questions or concerns regarding your invoice.   IF you received labwork today, you will receive an invoice from [facility]. Please contact [facility] at [phone] with questions or concerns regarding your invoice.   Our billing staff will not be able to assist you with questions regarding bills from these companies.  You will be contacted with the lab results as soon as they are available. The fastest way to get your results is to activate your My Chart account. Instructions are located on the last page of this paperwork. If you have not heard from us regarding the results in 2 weeks, please contact this office.    Community-Acquired Pneumonia, Adult Pneumonia is an infection of the lungs. There are different types of pneumonia. One type can develop while a person is in a hospital. A different type, called community-acquired pneumonia, develops in people who are not, or have not recently been, in the hospital or other health care facility.  CAUSES Pneumonia may be caused by bacteria, viruses, or funguses. Community-acquired pneumonia is often caused by Streptococcus pneumonia bacteria. These bacteria are often passed from one person to another by breathing in droplets from the cough or sneeze of an infected person. RISK FACTORS The condition is more likely to develop in:  People who havechronic diseases, such as chronic obstructive pulmonary disease (COPD), asthma, congestive heart failure, cystic fibrosis, diabetes, or kidney disease.  People who haveearly-stage or late-stage HIV.  People who havesickle cell disease.  People who havehad their spleen removed  (splenectomy).  People who havepoor Administratordental hygiene.  People who havemedical conditions that increase the risk of breathing in (aspirating) secretions their own mouth and nose.   People who havea weakened immune system (immunocompromised).  People who smoke.  People whotravel to areas where pneumonia-causing germs commonly exist.  People whoare around animal habitats or animals that have pneumonia-causing germs, including birds, bats, rabbits, cats, and farm animals. SYMPTOMS Symptoms of this condition include:  Adry cough.  A wet (productive) cough.  Fever.  Sweating.  Chest pain, especially when breathing deeply or coughing.  Rapid breathing or difficulty breathing.  Shortness of breath.  Shaking chills.  Fatigue.  Muscle aches. DIAGNOSIS Your health care provider will take a medical history and perform a physical exam. You may also have other tests, including:  Imaging studies of your chest, including X-rays.  Tests to check your blood oxygen level and other blood gases.  Other tests on blood, mucus (sputum), fluid around your lungs (pleural fluid), and urine. If your pneumonia is severe, other tests may be done to identify the specific cause of your illness. TREATMENT The type of treatment that you receive depends on many factors, such as the cause of your pneumonia, the medicines you take, and other medical conditions that you have. For most adults, treatment and recovery from pneumonia may occur at home. In some cases, treatment must happen in a hospital. Treatment may include:  Antibiotic medicines, if the pneumonia was caused by bacteria.  Antiviral medicines, if the pneumonia was caused by a virus.  Medicines that are given by mouth or through an IV tube.  Oxygen.  Respiratory therapy. Although rare, treating severe pneumonia may include:  Mechanical  ventilation. This is done if you are not breathing well on your own and you cannot maintain a  safe blood oxygen level.  Thoracentesis. This procedureremoves fluid around one lung or both lungs to help you breathe better. HOME CARE INSTRUCTIONS  Take over-the-counter and prescription medicines only as told by your health care provider.  Only takecough medicine if you are losing sleep. Understand that cough medicine can prevent your body's natural ability to remove mucus from your lungs.  If you were prescribed an antibiotic medicine, take it as told by your health care provider. Do not stop taking the antibiotic even if you start to feel better.  Sleep in a semi-upright position at night. Try sleeping in a reclining chair, or place a few pillows under your head.  Do not use tobacco products, including cigarettes, chewing tobacco, and e-cigarettes. If you need help quitting, ask your health care provider.  Drink enough water to keep your urine clear or pale yellow. This will help to thin out mucus secretions in your lungs. PREVENTION There are ways that you can decrease your risk of developing community-acquired pneumonia. Consider getting a pneumococcal vaccine if:  You are older than 74 years of age.  You are older than 74 years of age and are undergoing cancer treatment, have chronic lung disease, or have other medical conditions that affect your immune system. Ask your health care provider if this applies to you. There are different types and schedules of pneumococcal vaccines. Ask your health care provider which vaccination option is best for you. You may also prevent community-acquired pneumonia if you take these actions:  Get an influenza vaccine every year. Ask your health care provider which type of influenza vaccine is best for you.  Go to the dentist on a regular basis.  Wash your hands often. Use hand sanitizer if soap and water are not available. SEEK MEDICAL CARE IF:  You have a fever.  You are losing sleep because you cannot control your cough with cough  medicine. SEEK IMMEDIATE MEDICAL CARE IF:  You have worsening shortness of breath.  You have increased chest pain.  Your sickness becomes worse, especially if you are an older adult or have a weakened immune system.  You cough up blood.   This information is not intended to replace advice given to you by your health care provider. Make sure you discuss any questions you have with your health care provider.   Document Released: 04/03/2005 Document Revised: 12/23/2014 Document Reviewed: 07/29/2014 Elsevier Interactive Patient Education [employer]

## 2015-07-27 NOTE — Progress Notes (Signed)
By signing my name below, I, Stann Oresung-Kai Tsai, attest that this documentation has been prepared under the direction and in the presence of Lesle ChrisSteven Kennon Encinas, MD. Electronically Signed: Stann Oresung-Kai Tsai, Scribe. 07/27/2015 , 10:41 AM .  Patient was seen in room 11 .  Chief Complaint:  Chief Complaint  Patient presents with  . Cough    x 1 month    HPI: Shawn [name] is a 75 y.o. male [facility] of persistent cough that's been ongoing for a month. Patient states he had a cold with cough about a month ago. He was feeling better but his coughing fit started again a few days ago. He does report mowing his grass 4 days ago. He denies history of using inhalers. He believes this is caused by pollen.   He also informs having leg swelling, left worse than right. He denies taking any new medications. He's been taking HCTZ for about 6 months now.   Past Medical History  Diagnosis Date  . Arthritis   . Diverticulitis   . High cholesterol    Past Surgical History  Procedure Laterality Date  . Knee arthroscopy      Right   Social History   Social History  . Marital Status: Married    Spouse Name: N/A  . Number of Children: 3  . Years of Education: N/A   Occupational History  . retired    Social History Main Topics  . Smoking status: Never Smoker   . Smokeless tobacco: None  . Alcohol Use: 0.0 oz/week    0 Standard drinks or equivalent per week     Comment: 1-2 per week  . Drug Use: No  . Sexual Activity: Not Asked   Other Topics Concern  . None   Social History Narrative   Family History  Problem Relation Age of Onset  . Cancer Father   . Heart disease Brother    No Known Allergies Prior to Admission medications   Medication Sig Start Date End Date Taking? Authorizing Provider  allopurinol (ZYLOPRIM) 300 MG tablet Take 150 mg by mouth daily.    Yes Historical Provider, MD  fish oil-omega-3 fatty acids 1000 MG capsule Take 1 g by mouth daily.    Yes Historical Provider, MD  Glucosamine HCl (GLUCOSAMINE PO) Take 1 tablet by mouth daily.   Yes Historical Provider, MD  rosuvastatin (CRESTOR) 10 MG tablet Take 10 mg by mouth every morning.    Yes Historical Provider, MD  testosterone (ANDROGEL) 50 MG/5GM GEL Place 5 g onto the skin daily. Applies to shoulders and upper arms   Yes Historical Provider, MD     ROS:  Constitutional: negative for fever, chills, night sweats, weight changes, or fatigue  HEENT: negative for vision changes, hearing loss, congestion, rhinorrhea, ST, epistaxis, or sinus pressure Cardiovascular: negative for chest pain or palpitations Respiratory: negative for hemoptysis, shortness of breath; positive for cough, wheezing Abdominal: negative for abdominal pain, nausea, vomiting, diarrhea, or constipation Dermatological: negative for rash Neurologic: negative for headache, dizziness, or syncope All other systems reviewed and are otherwise negative with the exception to those above and in the HPI.  PHYSICAL EXAM: Filed Vitals:   07/27/15 1020 07/27/15 1022  BP: 146/80 138/82  Pulse: 81   Temp: 98.7 F (37.1 C)   Resp: 16    Body mass index is 37.24 kg/(m^2).   General: Alert, no acute distress HEENT:  Normocephalic, atraumatic, oropharynx patent. Turbinates congested Eye: Nonie [facility]OMI, [facility] Cardiovascular:  Regular rate and rhythm, no rubs murmurs or gallops.  No Carotid bruits, radial pulse intact. No pedal edema.  Respiratory: Clear to auscultation bilaterally.  Didn't hear any audible wheezes, no rales, or rhonchi.  No cyanosis, no use of accessory musculature Abdominal: No organomegaly, abdomen is soft and non-tender, positive bowel sounds. No masses. Musculoskeletal: Gait intact. Trace edema both legs but slightly worse on left Skin: No rashes. Neurologic: Facial musculature symmetric. Psychiatric: Patient acts appropriately throughout our interaction.  Lymphatic: No cervical or submandibular  lymphadenopathy Genitourinary/Anorectal: No acute findings  LABS: Results for orders placed or performed in visit on 07/27/15  POCT CBC  Result Value Ref Range   WBC 6.2 4.6 - 10.2 K/uL   Lymph, poc 1.7 0.6 - 3.4   POC LYMPH PERCENT 27.6 10 - 50 %L   MID (cbc) 0.7 0 - 0.9   POC MID % 11.6 0 - 12 %M   POC Granulocyte 3.8 2 - 6.9   Granulocyte percent 60.8 37 - 80 %G   RBC 4.79 4.69 - 6.13 M/uL   Hemoglobin 14.7 14.1 - 18.1 g/dL   HCT, POC 16.1 (A) 09.6 - 53.7 %   MCV 86.1 80 - 97 fL   MCH, POC 30.7 27 - 31.2 pg   MCHC 35.7 (A) 31.8 - 35.4 g/dL   RDW, POC 04.5 %   Platelet Count, POC 193 142 - 424 K/uL   MPV 6.7 0 - 99.8 fL    EKG/XRAY:   Dg Chest 2 View  07/27/2015  CLINICAL DATA:  Two weeks of cough and chest congestion, nonsmoker. EXAM: CHEST  2 VIEW COMPARISON:  None in PACs FINDINGS: The lungs are adequately inflated. There are patchy increased densities in the anterior aspects of the lower lobes bilaterally. The heart and pulmonary vascularity are normal. The mediastinum is normal in width. There is no pleural effusion. There is mild multilevel degenerative disc disease of the thoracic spine. IMPRESSION: Atelectasis or early interstitial pneumonia in the anterior aspects of both lower lobes. Followup PA and lateral chest X-ray is recommended in 3-4 weeks following trial of antibiotic therapy to ensure resolution and exclude underlying malignancy. Electronically Signed   By: David  [place] M.D.   On: 07/27/2015 11:11   ASSESSMENT/PLAN: Patient has infiltrates in both bases. He had a respiratory illness 3-4 weeks ago which seemed to get better. His chest x-ray is concerning for community-acquired pneumonia. He also is on testosterone replacement and has had some leg swelling. Will check venous Doppler study to follow-up on this. He will be on Z-Pak Tessalon Perles and Mucinex.I personally performed the services described in this documentation, which was scribed in my presence. The  recorded information has been reviewed and is accurate.  Gross sideeffects, risk and benefits, and alternatives of medications d/w patient. Patient is aware that all medications have potential sideeffects and we are unable to predict every sideeffect or drug-drug interaction that may occur.  Lesle Chris MD 07/27/2015 10:41 AM

## 2015-07-28 ENCOUNTER — Ambulatory Visit (HOSPITAL_COMMUNITY)
Admission: RE | Admit: 2015-07-28 | Discharge: 2015-07-28 | Disposition: A | Discharge status: Home / Self Care | Payer: Medicare Other | Source: Ambulatory Visit | Attending: Cardiology | Admitting: Cardiology

## 2015-07-28 DIAGNOSIS — R05 Cough: Secondary | ICD-10-CM | POA: Insufficient documentation

## 2015-07-28 DIAGNOSIS — E7800 Pure hypercholesterolemia, unspecified: Secondary | ICD-10-CM | POA: Insufficient documentation

## 2015-07-28 DIAGNOSIS — M7989 Other specified soft tissue disorders: Secondary | ICD-10-CM | POA: Diagnosis not present

## 2015-07-28 DIAGNOSIS — R609 Edema, unspecified: Secondary | ICD-10-CM | POA: Diagnosis not present

## 2015-07-28 DIAGNOSIS — R059 Cough, unspecified: Secondary | ICD-10-CM

## 2015-08-03 ENCOUNTER — Ambulatory Visit (INDEPENDENT_AMBULATORY_CARE_PROVIDER_SITE_OTHER): Payer: Medicare Other

## 2015-08-03 ENCOUNTER — Ambulatory Visit (INDEPENDENT_AMBULATORY_CARE_PROVIDER_SITE_OTHER): Payer: Medicare Other | Admitting: Emergency Medicine

## 2015-08-03 VITALS — BP 160/90 | HR 78 | Temp 98.0°F | Resp 18 | Ht 66.0 in | Wt 227.0 lb

## 2015-08-03 DIAGNOSIS — I499 Cardiac arrhythmia, unspecified: Secondary | ICD-10-CM

## 2015-08-03 DIAGNOSIS — R059 Cough, unspecified: Secondary | ICD-10-CM

## 2015-08-03 DIAGNOSIS — R05 Cough: Secondary | ICD-10-CM

## 2015-08-03 MED ORDER — BENZONATATE 100 MG PO CAPS: 100.0000 mg | ORAL_CAPSULE | Freq: Three times a day (TID) | ORAL | Status: DC | PRN

## 2015-08-03 MED ORDER — RANITIDINE HCL 150 MG PO TABS: 150.0000 mg | ORAL_TABLET | Freq: Two times a day (BID) | ORAL | Status: DC

## 2015-08-03 NOTE — Patient Instructions (Addendum)
   IF you received an x-ray today, you will receive an invoice from [facility]. Please contact [facility] at [phone] with questions or concerns regarding your invoice.   IF you received labwork today, you will receive an invoice from [facility]. Please contact [facility] at [phone] with questions or concerns regarding your invoice.   Our billing staff will not be able to assist you with questions regarding bills from these companies.  You will be contacted with the lab results as soon as they are available. The fastest way to get your results is to activate your My Chart account. Instructions are located on the last page of this paperwork. If you have not heard from us regarding the results in 2 weeks, please contact this office.     DASH Eating Plan DASH stands for "Dietary Approaches to Stop Hypertension." The DASH eating plan is a healthy eating plan that has been shown to reduce high blood pressure (hypertension). Additional health benefits may include reducing the risk of type 2 diabetes mellitus, heart disease, and stroke. The DASH eating plan may also help with weight loss. WHAT DO I NEED TO KNOW ABOUT THE DASH EATING PLAN? For the DASH eating plan, you will follow these general guidelines:  Choose foods with a percent daily value for sodium of less than 5% (as listed on the food label).  Use salt-free seasonings or herbs instead of table salt or sea salt.  Check with your health care provider or pharmacist before using salt substitutes.  Eat lower-sodium products, often labeled as "lower sodium" or "no salt added."  Eat fresh foods.  Eat more vegetables, fruits, and low-fat dairy products.  Choose whole grains. Look for the word "whole" as the first word in the ingredient list.  Choose fish and skinless chicken or turkey more often than red meat. Limit fish, poultry, and meat to 6 oz (170 g) each day.  Limit sweets,  desserts, sugars, and sugary drinks.  Choose heart-healthy fats.  Limit cheese to 1 oz (28 g) per day.  Eat more home-cooked food and less restaurant, buffet, and fast food.  Limit fried foods.  Cook foods using methods other than frying.  Limit canned vegetables. If you do use them, rinse them well to decrease the sodium.  When eating at a restaurant, ask that your food be prepared with less salt, or no salt if possible. WHAT FOODS CAN I EAT? Seek help from a dietitian for individual calorie needs. Grains Whole grain or whole wheat bread. Brown rice. Whole grain or whole wheat pasta. Quinoa, bulgur, and whole grain cereals. Low-sodium cereals. Corn or whole wheat flour tortillas. Whole grain cornbread. Whole grain crackers. Low-sodium crackers. Vegetables Fresh or frozen vegetables (raw, steamed, roasted, or grilled). Low-sodium or reduced-sodium tomato and vegetable juices. Low-sodium or reduced-sodium tomato sauce and paste. Low-sodium or reduced-sodium canned vegetables.  Fruits All fresh, canned (in natural juice), or frozen fruits. Meat and Other Protein Products Ground beef (85% or leaner), grass-fed beef, or beef trimmed of fat. Skinless chicken or turkey. Ground chicken or turkey. Pork trimmed of fat. All fish and seafood. Eggs. Dried beans, peas, or lentils. Unsalted nuts and seeds. Unsalted canned beans. Dairy Low-fat dairy products, such as skim or 1% milk, 2% or reduced-fat cheeses, low-fat ricotta or cottage cheese, or plain low-fat yogurt. Low-sodium or reduced-sodium cheeses. Fats and Oils Tub margarines without trans fats. Light or reduced-fat mayonnaise and salad dressings (reduced sodium). Avocado. Safflower, olive, or canola   oils. Natural peanut or almond butter. Other Unsalted popcorn and pretzels. The items listed above may not be a complete list of recommended foods or beverages. Contact your dietitian for more options. WHAT FOODS ARE NOT  RECOMMENDED? Grains White bread. White pasta. White rice. Refined cornbread. Bagels and croissants. Crackers that contain trans fat. Vegetables Creamed or fried vegetables. Vegetables in a cheese sauce. Regular canned vegetables. Regular canned tomato sauce and paste. Regular tomato and vegetable juices. Fruits Dried fruits. Canned fruit in light or heavy syrup. Fruit juice. Meat and Other Protein Products Fatty cuts of meat. Ribs, chicken wings, bacon, sausage, bologna, salami, chitterlings, fatback, hot dogs, bratwurst, and packaged luncheon meats. Salted nuts and seeds. Canned beans with salt. Dairy Whole or 2% milk, cream, half-and-half, and cream cheese. Whole-fat or sweetened yogurt. Full-fat cheeses or blue cheese. Nondairy creamers and whipped toppings. Processed cheese, cheese spreads, or cheese curds. Condiments Onion and garlic salt, seasoned salt, table salt, and sea salt. Canned and packaged gravies. Worcestershire sauce. Tartar sauce. Barbecue sauce. Teriyaki sauce. Soy sauce, including reduced sodium. Steak sauce. Fish sauce. Oyster sauce. Cocktail sauce. Horseradish. Ketchup and mustard. Meat flavorings and tenderizers. Bouillon cubes. Hot sauce. Tabasco sauce. Marinades. Taco seasonings. Relishes. Fats and Oils Butter, stick margarine, lard, shortening, ghee, and bacon fat. Coconut, palm kernel, or palm oils. Regular salad dressings. Other Pickles and olives. Salted popcorn and pretzels. The items listed above may not be a complete list of foods and beverages to avoid. Contact your dietitian for more information. WHERE CAN I FIND MORE INFORMATION? National Heart, Lung, and Blood Institute: [employer]   This information is not intended to replace advice given to you by your health care provider. Make sure you discuss any questions you have with your health care provider.   Document Released: 03/23/2011 Document Revised: 04/24/2014  Document Reviewed: 02/05/2013 Elsevier Interactive Patient Education 2016 Elsevier Inc. Cough, Adult Coughing is a reflex that clears your throat and your airways. Coughing helps to heal and protect your lungs. It is normal to cough occasionally, but a cough that happens with other symptoms or lasts a long time may be a sign of a condition that needs treatment. A cough may last only 2-3 weeks (acute), or it may last longer than 8 weeks (chronic). CAUSES Coughing is commonly caused by:  Breathing in substances that irritate your lungs.  A viral or bacterial respiratory infection.  Allergies.  Asthma.  Postnasal drip.  Smoking.  Acid backing up from the stomach into the esophagus (gastroesophageal reflux).  Certain medicines.  Chronic lung problems, including COPD (or rarely, lung cancer).  Other medical conditions such as heart failure. HOME CARE INSTRUCTIONS  Pay attention to any changes in your symptoms. Take these actions to help with your discomfort:  Take medicines only as told by your health care provider.  If you were prescribed an antibiotic medicine, take it as told by your health care provider. Do not stop taking the antibiotic even if you start to feel better.  Talk with your health care provider before you take a cough suppressant medicine.  Drink enough fluid to keep your urine clear or pale yellow.  If the air is dry, use a cold steam vaporizer or humidifier in your bedroom or your home to help loosen secretions.  Avoid anything that causes you to cough at work or at home.  If your cough is worse at night, try sleeping in a semi-upright position.  Avoid cigarette smoke. If you smoke, quit smoking.  If you need help quitting, ask your health care provider.  Avoid caffeine.  Avoid alcohol.  Rest as needed. SEEK MEDICAL CARE IF:   You have new symptoms.  You cough up pus.  Your cough does not get better after 2-3 weeks, or your cough gets  worse.  You cannot control your cough with suppressant medicines and you are losing sleep.  You develop pain that is getting worse or pain that is not controlled with pain medicines.  You have a fever.  You have unexplained weight loss.  You have night sweats. SEEK IMMEDIATE MEDICAL CARE IF:  You cough up blood.  You have difficulty breathing.  Your heartbeat is very fast.   This information is not intended to replace advice given to you by your health care provider. Make sure you discuss any questions you have with your health care provider.   Document Released: 09/30/2010 Document Revised: 12/23/2014 Document Reviewed: 06/10/2014 Elsevier Interactive Patient Education [employer]2016 Elsevier Inc.

## 2015-08-03 NOTE — Progress Notes (Signed)
Subjective: Shawn [name] is a 75 y.o. male presenting for follow up of cough.   He was last seen here 4/11 for URI symptoms and persistent cough, found to have bibasilar infiltrates concerning for CAP. He was started on azithromycin, tessalon perles, and mucinex. He had leg swelling and negative doppler for DVT, though swelling has continued. The cough is nonproductive, occasionally associated with wheezing, improved somewhat with the abx and cough medication. It is worse when laying recumbent causing him to sleep with 3 pillows. He endorses episodes of waking up coughing associated with trouble breathing, but no exertional dyspnea. He's also developed some hoarseness over the past 2 - 3 days which he attributes to the coughing.   - ROS: Denies hemoptysis, fever, itchy throat, sore throat, nasal congestion, runny nose. No chest pain or palpitations.  - Non-smoker, no h/o occupational exposures to asbestos, solvents. [place]  - Takes irbesartan-HCTZ. Has a history of OSA. Denies history of asthma/COPD or use of inhaler.  Objective: BP 160/90 mmHg  Pulse 78  Temp(Src) 98 F (36.7 C) (Oral)  Resp 18  Ht  (1.676 m)  Wt 227 lb (102.967 kg)  BMI 36.66 kg/m2  SpO2 96% Gen: Pleasant, obese 75 y.o. male in no distress HEENT: Conjunctivae normal, PERL, boggy turbinates, MMM, posterior oropharynx erythematous, poor dentition Pulm: Non-labored breathing ambient air; CTAB, no wheezes or crackles CV: Regular rate with consistently dropped beats over 60 sec of auscultation, no murmur, rub or gallop. Trace bilateral LE edema without JVD (difficult to appreciate with habitus). cap refill < 2 sec. Dg Chest 2 View  08/03/2015  CLINICAL DATA:  Cough EXAM: CHEST  2 VIEW COMPARISON:  07/27/2015 FINDINGS: Normal heart size, mediastinal contours, and pulmonary vascularity. Minimal bibasilar atelectasis. Lungs otherwise clear. No pleural effusion or pneumothorax. Bones unremarkable. IMPRESSION:  Minimal bibasilar atelectasis. Electronically Signed   By: Ulyses Southward M.D.   On: 08/03/2015 12:09   Dg Chest 2 View  07/27/2015  CLINICAL DATA:  Two weeks of cough and chest congestion, nonsmoker. EXAM: CHEST  2 VIEW COMPARISON:  None in PACs FINDINGS: The lungs are adequately inflated. There are patchy increased densities in the anterior aspects of the lower lobes bilaterally. The heart and pulmonary vascularity are normal. The mediastinum is normal in width. There is no pleural effusion. There is mild multilevel degenerative disc disease of the thoracic spine. IMPRESSION: Atelectasis or early interstitial pneumonia in the anterior aspects of both lower lobes. Followup PA and lateral chest X-ray is recommended in 3-4 weeks following trial of antibiotic therapy to ensure resolution and exclude underlying malignancy. Electronically Signed   By: David  [place] M.D.   On: 07/27/2015 11:11     Axis deviation and no other abnormalities.  Assessment/Plan: Shawn [name] is a 75 y.o. male here for ongoing cough, somewhat improved after treatment of CAP. Could be normal resolution of URI. Some of this could also be upper airway related and I called in some Zantac 150 twice a day to block any reflux. He does absolutely do better when he sits rather than lying flat. - CXR recheck.  - ECG to evaluate heart rhythm - If cough continues, consider D/C ARB. No change in treatment at the present time. He will need one further x-ray in about 2 weeks. He does have some swelling of the lower extremities and I encouraged him to wear support stockings. His blood pressure was not at goal. He has an appointment to see Dr. Chestine Spore  next month and will discuss his blood pressure elevation with him at that time.

## 2016-09-06 ENCOUNTER — Encounter (HOSPITAL_COMMUNITY): Payer: Self-pay

## 2016-09-06 ENCOUNTER — Emergency Department (HOSPITAL_COMMUNITY)
Admission: EM | Admit: 2016-09-06 | Discharge: 2016-09-06 | Disposition: A | Discharge status: Home / Self Care | Payer: Medicare Other | Attending: Emergency Medicine | Admitting: Emergency Medicine

## 2016-09-06 DIAGNOSIS — Z79899 Other long term (current) drug therapy: Secondary | ICD-10-CM | POA: Insufficient documentation

## 2016-09-06 DIAGNOSIS — K5792 Diverticulitis of intestine, part unspecified, without perforation or abscess without bleeding: Secondary | ICD-10-CM | POA: Insufficient documentation

## 2016-09-06 DIAGNOSIS — R1032 Left lower quadrant pain: Secondary | ICD-10-CM | POA: Diagnosis present

## 2016-09-06 LAB — CBC
HCT: 41.9 % (ref 39.0–52.0)
HEMOGLOBIN: 14.2 g/dL (ref 13.0–17.0)
MCH: 30.2 pg (ref 26.0–34.0)
MCHC: 33.9 g/dL (ref 30.0–36.0)
MCV: 89.1 fL (ref 78.0–100.0)
Platelets: 159 10*3/uL (ref 150–400)
RBC: 4.7 MIL/uL (ref 4.22–5.81)
RDW: 15 % (ref 11.5–15.5)
WBC: 15.9 10*3/uL — AB (ref 4.0–10.5)

## 2016-09-06 LAB — URINALYSIS, ROUTINE W REFLEX MICROSCOPIC
Bilirubin Urine: NEGATIVE
Glucose, UA: NEGATIVE mg/dL
HGB URINE DIPSTICK: NEGATIVE
Ketones, ur: NEGATIVE mg/dL
LEUKOCYTES UA: NEGATIVE
Nitrite: NEGATIVE
Protein, ur: NEGATIVE mg/dL
SPECIFIC GRAVITY, URINE: 1.024 (ref 1.005–1.030)
pH: 5 (ref 5.0–8.0)

## 2016-09-06 LAB — COMPREHENSIVE METABOLIC PANEL
ALT: 16 U/L — ABNORMAL LOW (ref 17–63)
ANION GAP: 8 (ref 5–15)
AST: 19 U/L (ref 15–41)
Albumin: 4 g/dL (ref 3.5–5.0)
Alkaline Phosphatase: 42 U/L (ref 38–126)
BUN: 20 mg/dL (ref 6–20)
CHLORIDE: 106 mmol/L (ref 101–111)
CO2: 23 mmol/L (ref 22–32)
Calcium: 8.9 mg/dL (ref 8.9–10.3)
Creatinine, Ser: 1.26 mg/dL — ABNORMAL HIGH (ref 0.61–1.24)
GFR calc non Af Amer: 54 mL/min — ABNORMAL LOW (ref >60)
Glucose, Bld: 116 mg/dL — ABNORMAL HIGH (ref 65–99)
Potassium: 3.9 mmol/L (ref 3.5–5.1)
SODIUM: 137 mmol/L (ref 135–145)
Total Bilirubin: 1 mg/dL (ref 0.3–1.2)
Total Protein: 7 g/dL (ref 6.5–8.1)

## 2016-09-06 LAB — LIPASE, BLOOD: LIPASE: 78 U/L — AB (ref 11–51)

## 2016-09-06 MED ORDER — ONDANSETRON 8 MG PO TBDP
8.0000 mg | ORAL_TABLET | Freq: Once | ORAL | Status: AC
  Administered 2016-09-06: 8 mg via ORAL
  Filled 2016-09-06: qty 1

## 2016-09-06 MED ORDER — ONDANSETRON HCL 4 MG PO TABS: 4.0000 mg | ORAL_TABLET | Freq: Three times a day (TID) | ORAL | 0 refills | Status: DC | PRN

## 2016-09-06 MED ORDER — CIPROFLOXACIN HCL 500 MG PO TABS: 500.0000 mg | ORAL_TABLET | Freq: Two times a day (BID) | ORAL | 0 refills | Status: DC

## 2016-09-06 MED ORDER — METRONIDAZOLE 500 MG PO TABS
500.0000 mg | ORAL_TABLET | Freq: Once | ORAL | Status: AC
  Administered 2016-09-06: 500 mg via ORAL
  Filled 2016-09-06: qty 1

## 2016-09-06 MED ORDER — METRONIDAZOLE 500 MG PO TABS: 500.0000 mg | ORAL_TABLET | Freq: Two times a day (BID) | ORAL | 0 refills | Status: DC

## 2016-09-06 MED ORDER — CIPROFLOXACIN HCL 500 MG PO TABS
500.0000 mg | ORAL_TABLET | Freq: Once | ORAL | Status: AC
  Administered 2016-09-06: 500 mg via ORAL
  Filled 2016-09-06: qty 1

## 2016-09-06 NOTE — ED Provider Notes (Signed)
[facility]-EMERGENCY DEPT Provider Note   CSN: [id number] Arrival date & time: 09/06/16  1441     History   Chief Complaint Chief Complaint  Patient presents with  . Flank Pain  . Diarrhea    HPI Shawn [name] is a 76 y.o. male who presents emergency Department with chief complaint of diarrhea and abdominal pain. The patient states that last night around 10:30 PM he began getting shaking chills. This was followed by an episode of tenesmus and brown watery diarrhea. He states that he had 2 episodes of diarrhea without blood or mucus. He had associated nausea without vomiting. His pain is located in the left lower quadrant of his abdomen. The patient states that this feels exactly like his prior episodes of diverticulitis. He has not had any fevers. He states that he had currently has minimal pain and that his pain only occurs when he gets cramping prior to having diarrhea. He has no recent foreign travel, contact with similar symptoms. History of recent antibiotic use.  HPI  Past Medical History:  Diagnosis Date  . Arthritis   . Diverticulitis   . High cholesterol     Patient Active Problem List   Diagnosis Date Noted  . OSA (obstructive sleep apnea) 04/28/2014    Past Surgical History:  Procedure Laterality Date  . KNEE ARTHROSCOPY     Right       Home Medications    Prior to Admission medications   Medication Sig Start Date End Date Taking? Authorizing Provider  allopurinol (ZYLOPRIM) 300 MG tablet Take 150 mg by mouth daily.    Yes [provider]  ibuprofen (ADVIL,MOTRIN) 200 MG tablet Take 200 mg by mouth every 6 (six) hours as needed for moderate pain.   Yes [provider]  irbesartan-hydrochlorothiazide (AVALIDE) 150-12.5 MG tablet TK 1 T PO QD 06/03/15  Yes [provider]  rosuvastatin (CRESTOR) 10 MG tablet Take 10 mg by mouth every morning.    Yes [provider]  testosterone (ANDROGEL) 50 MG/5GM GEL Place 5 g onto the skin  daily. Applies to shoulders and upper arms   Yes [provider]  azithromycin (ZITHROMAX) 250 MG tablet Take 2 tabs PO x 1 dose, then 1 tab PO QD x 4 days Patient not taking: Reported on 08/03/2015 07/27/15   Collene Gobble, MD  benzonatate (TESSALON) 100 MG capsule Take 1-2 capsules (100-200 mg total) by mouth 3 (three) times daily as needed for cough. Patient not taking: Reported on 09/06/2016 08/03/15   Collene Gobble, MD  ranitidine (ZANTAC) 150 MG tablet Take 1 tablet (150 mg total) by mouth 2 (two) times daily. Patient not taking: Reported on 09/06/2016 08/03/15   Collene Gobble, MD    Family History Family History  Problem Relation Age of Onset  . Cancer Father   . Heart disease Brother     Social History Social History  Substance Use Topics  . Smoking status: Never Smoker  . Smokeless tobacco: Never Used  . Alcohol use 0.0 oz/week     Comment: 1-2 per week     Allergies   Patient has no known allergies.   Review of Systems Review of Systems   Ten systems reviewed and are negative for acute change, except as noted in the HPI.    Physical Exam Updated Vital Signs BP 140/74 (BP Location: Left Arm)   Pulse 74   Temp 99 F (37.2 C) (Oral)   Resp 18   Ht 5'  6" (1.676 m)   Wt 103.4 kg (228 lb)   SpO2 94%   BMI 36.80 kg/m   Physical Exam  Constitutional: He is oriented to person, place, and time. He appears well-developed and well-nourished. No distress.  HENT:  Head: Normocephalic and atraumatic.  Eyes: Conjunctivae are normal. No scleral icterus.  Neck: Normal range of motion. Neck supple.  Cardiovascular: Normal rate, regular rhythm and normal heart sounds.   Pulmonary/Chest: Effort normal and breath sounds normal. No respiratory distress.  Abdominal: Soft. There is tenderness.  Patient with minimal tenderness in the left lower quadrant, no CVA tenderness. No guarding or rebound tenderness.  Musculoskeletal: He exhibits no edema.  Neurological: He is  alert and oriented to person, place, and time.  Skin: Skin is warm and dry. He is not diaphoretic.  Psychiatric: His behavior is normal.  Nursing note and vitals reviewed.    ED Treatments / Results  Labs (all labs ordered are listed, but only abnormal results are displayed) Labs Reviewed  CBC - Abnormal; Notable for the following:       Result Value   WBC 15.9 (*)    All other components within normal limits  URINALYSIS, ROUTINE W REFLEX MICROSCOPIC  LIPASE, BLOOD  COMPREHENSIVE METABOLIC PANEL    EKG  EKG Interpretation None       Radiology No results found.  Procedures Procedures (including critical care time)  Medications Ordered in ED Medications  ondansetron (ZOFRAN-ODT) disintegrating tablet 8 mg (not administered)  ciprofloxacin (CIPRO) tablet 500 mg (not administered)  metroNIDAZOLE (FLAGYL) tablet 500 mg (not administered)     Initial Impression / Assessment and Plan / ED Course  I have reviewed the triage vital signs and the nursing notes.  Pertinent labs & imaging results that were available during my care of the patient were reviewed by me and considered in my medical decision making (see chart for details).  Clinical Course as of Sep 08 121  Wed Sep 06, 2016  [id number] 76 year old male with previous episodes of diverticulitis who presents with diarrhea and left lower quadrant pain which feels identical to his previous episodes. The patient is afebrile with a fairly benign abdominal exam. I have no concern for abscess or perforated bowel. He does have an elevated white count. The patient will receive oral Cipro and Flagyl. His CMP and lipase are currently pending. He is well-appearing.  [AH]  1722 Lipase is slightly elevated Lipase: (!) 78 [AH]  1722 Creatinine is at baseline Creatinine: (!) 1.26 [AH]    Clinical Course User Index [AH] Arthor CaptainHarris, Kaidan Harpster, PA-C   The patient's previous labs reviewed. No significant abnormalities. Creatinine at baseline.  He'll be discharged with Flagyl, Cipro, Zofran and bowel rest. I discussed return precautions. He is advised follow-up with his PCP in the next 2-3 days. Pierce safe for discharge at this time. Final Clinical Impressions(s) / ED Diagnoses   Final diagnoses:  None    New Prescriptions New Prescriptions   No medications on file     Arthor CaptainHarris, Shay Bartoli, PA-C 09/08/16 0124    Derwood KaplanNanavati, Ankit, MD 09/08/16 [phone]

## 2016-09-06 NOTE — Discharge Instructions (Signed)
Contact a health care provider if: °Your pain does not improve. °You have a hard time eating food. °Your bowel movements do not return to normal. °Get help right away if: °Your pain becomes worse. °Your symptoms do not get better. °Your symptoms suddenly get worse. °You have a fever. °You have repeated vomiting. °You have bloody or black, tarry stools. °

## 2016-09-06 NOTE — ED Triage Notes (Signed)
Pt started with temp 100.? Last night.  Pt states started with rt flank pain and left lower abdominal pain.  Diarrhea.  Hx of same and dx with diverticulitis.  No N/V

## 2017-06-13 IMAGING — CR DG CHEST 2V
2 series · 2 of 2 positions shown · non-contrast
Comparison: None in PACs

CLINICAL DATA: Two weeks of cough and chest congestion, nonsmoker.

EXAM:
CHEST  2 VIEW

[PA]
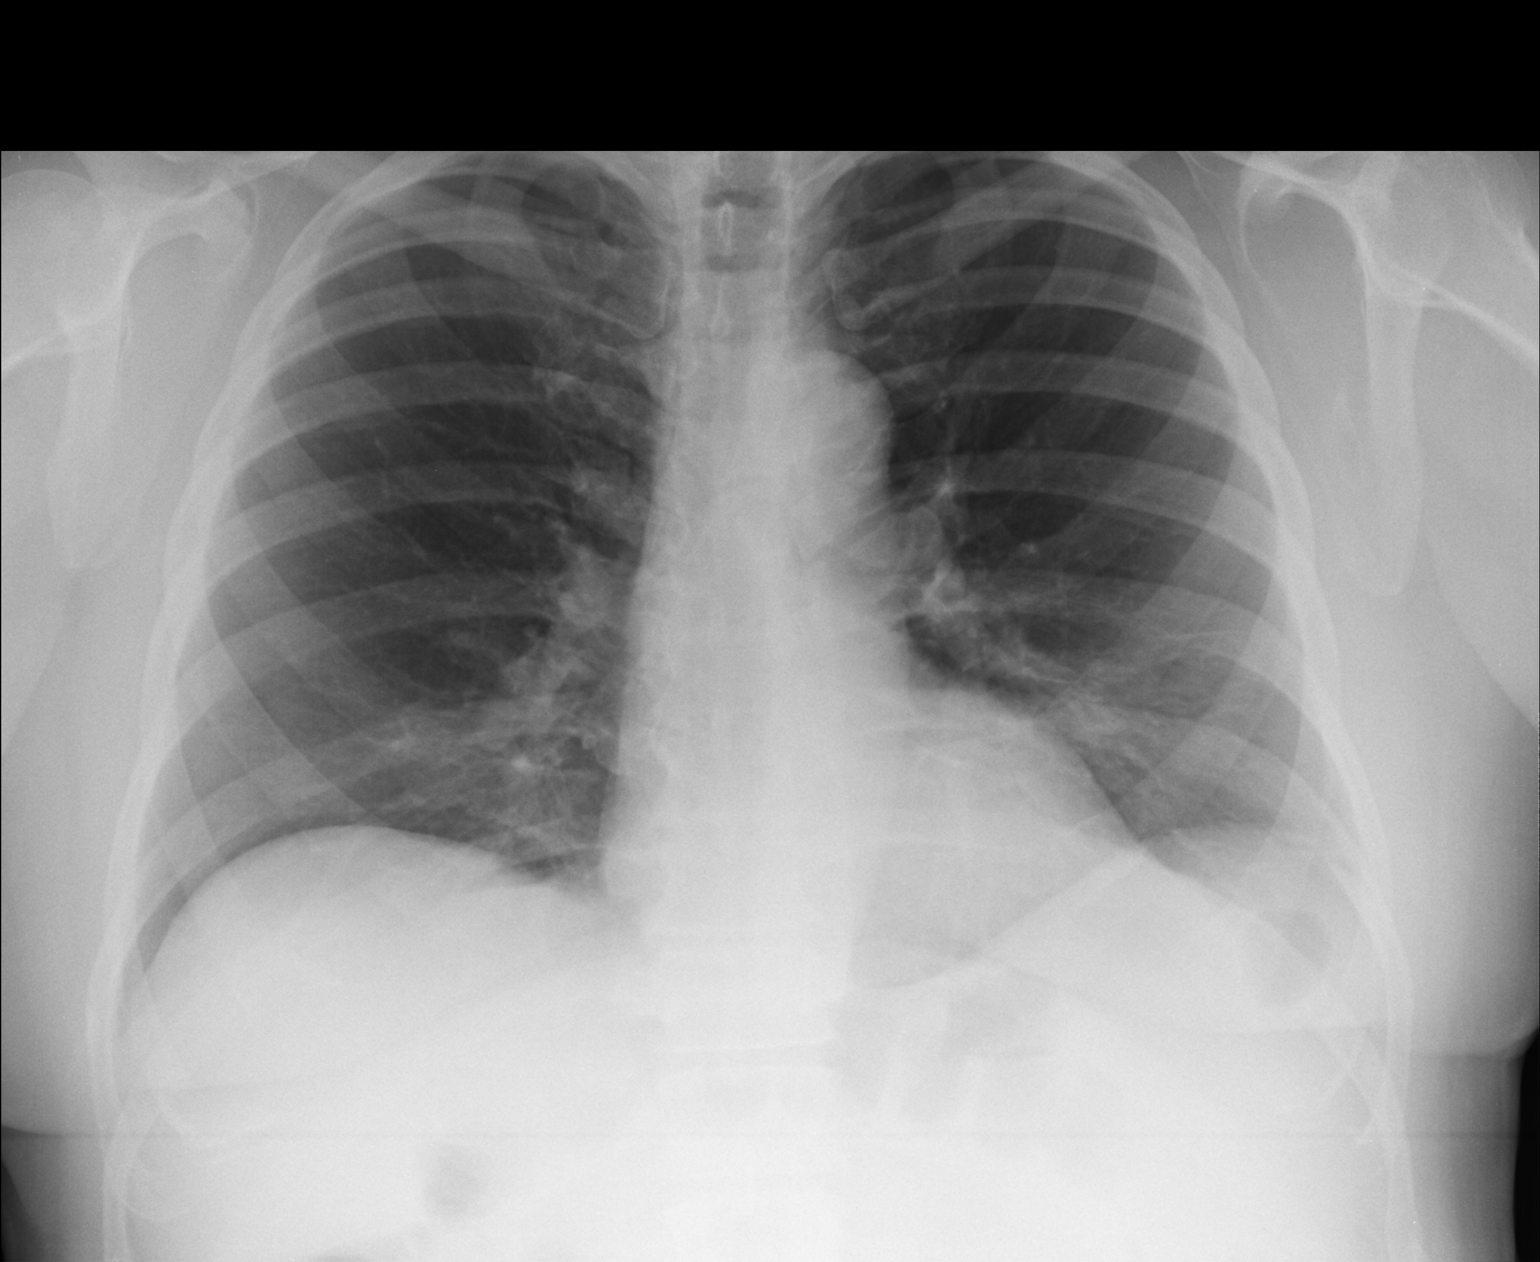

[lateral]
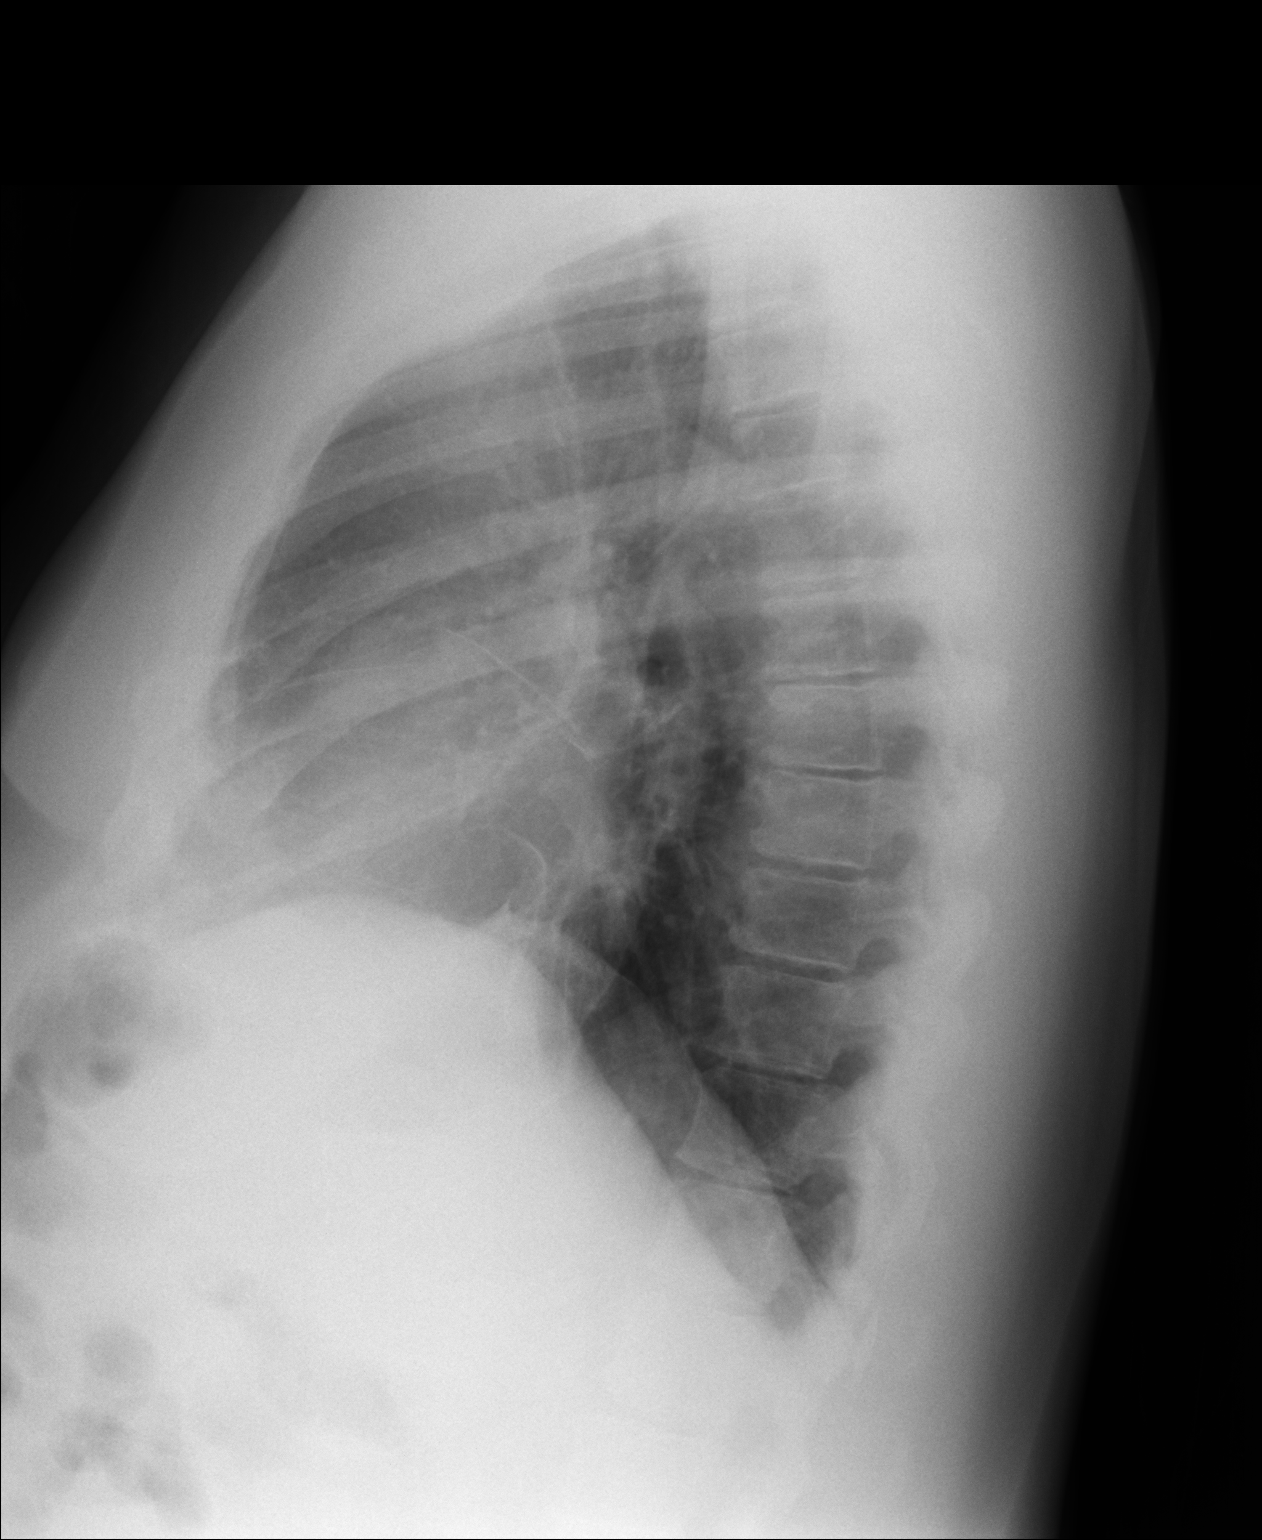

[2 of 2 positions shown; findings below may reference images not displayed]

FINDINGS: The lungs are adequately inflated. There are patchy increased
densities in the anterior aspects of the lower lobes bilaterally.
The heart and pulmonary vascularity are normal. The mediastinum is
normal in width. There is no pleural effusion. There is mild
multilevel degenerative disc disease of the thoracic spine.
IMPRESSION: Atelectasis or early interstitial pneumonia in the anterior aspects
of both lower lobes.

Followup PA and lateral chest X-ray is recommended in 3-4 weeks
following trial of antibiotic therapy to ensure resolution and
exclude underlying malignancy.

## 2019-07-02 DIAGNOSIS — M109 Gout, unspecified: Secondary | ICD-10-CM | POA: Diagnosis not present

## 2019-09-22 DIAGNOSIS — E1169 Type 2 diabetes mellitus with other specified complication: Secondary | ICD-10-CM | POA: Diagnosis not present

## 2019-09-22 DIAGNOSIS — E119 Type 2 diabetes mellitus without complications: Secondary | ICD-10-CM | POA: Diagnosis not present

## 2019-09-22 DIAGNOSIS — E291 Testicular hypofunction: Secondary | ICD-10-CM | POA: Diagnosis not present

## 2019-09-22 DIAGNOSIS — E7800 Pure hypercholesterolemia, unspecified: Secondary | ICD-10-CM | POA: Diagnosis not present

## 2019-09-22 DIAGNOSIS — I10 Essential (primary) hypertension: Secondary | ICD-10-CM | POA: Diagnosis not present

## 2019-11-17 DIAGNOSIS — M170 Bilateral primary osteoarthritis of knee: Secondary | ICD-10-CM | POA: Diagnosis not present

## 2020-02-01 ENCOUNTER — Emergency Department (HOSPITAL_COMMUNITY)
Admission: EM | Admit: 2020-02-01 | Discharge: 2020-02-01 | Disposition: A | Discharge status: Home / Self Care | Payer: Medicare PPO | Attending: Emergency Medicine | Admitting: Emergency Medicine

## 2020-02-01 ENCOUNTER — Encounter (HOSPITAL_COMMUNITY): Payer: Self-pay

## 2020-02-01 ENCOUNTER — Other Ambulatory Visit: Payer: Self-pay

## 2020-02-01 DIAGNOSIS — Z23 Encounter for immunization: Secondary | ICD-10-CM | POA: Insufficient documentation

## 2020-02-01 DIAGNOSIS — W1839XA Other fall on same level, initial encounter: Secondary | ICD-10-CM | POA: Insufficient documentation

## 2020-02-01 DIAGNOSIS — R030 Elevated blood-pressure reading, without diagnosis of hypertension: Secondary | ICD-10-CM

## 2020-02-01 DIAGNOSIS — S0181XA Laceration without foreign body of other part of head, initial encounter: Secondary | ICD-10-CM | POA: Diagnosis not present

## 2020-02-01 DIAGNOSIS — I10 Essential (primary) hypertension: Secondary | ICD-10-CM | POA: Diagnosis not present

## 2020-02-01 MED ORDER — TETANUS-DIPHTH-ACELL PERTUSSIS 5-2.5-18.5 LF-MCG/0.5 IM SUSP
0.5000 mL | Freq: Once | INTRAMUSCULAR | Status: AC
  Administered 2020-02-01: 0.5 mL via INTRAMUSCULAR
  Filled 2020-02-01: qty 0.5

## 2020-02-01 NOTE — Discharge Instructions (Addendum)
It was our pleasure to provide your ER care today - we hope that you feel better.  Keep wound clean.   The sutures used are absorbable. You may also have them removed in 5-6 days should you so choose.   Your blood pressure is high today - follow up with your doctor in the next couple weeks.   Return to ER if worse, new symptoms, infection of wound, spreading redness, pus, severe headache, or other concern.

## 2020-02-01 NOTE — ED Triage Notes (Addendum)
Pt arrived via walk in, small lac to right temple, states he fell into cabinet while putting up halloween decorations. No LOC, no blood thinners. Bleeding controlled.

## 2020-02-01 NOTE — ED Provider Notes (Addendum)
[facility]-EMERGENCY DEPT Provider Note   CSN: [id number] Arrival date & time: 02/01/20  1837     History Chief Complaint  Patient presents with  . Head Laceration    Shawn [name] is a 79 y.o. male.  Patient indicates he accidentally walked into cabinet with contusion/laceration to right forehead. Symptoms acute onset this evening, episodic, persistent. No loc. No headache. No neck or back pain. Denies any other trauma or injury. No anticoagulant use. Tetanus unknown. 1 cm laceration to forehead.   The history is provided by the patient.  Head Laceration Pertinent negatives include no headaches.       Past Medical History:  Diagnosis Date  . Arthritis   . Diverticulitis   . High cholesterol     Patient Active Problem List   Diagnosis Date Noted  . OSA (obstructive sleep apnea) 04/28/2014    Past Surgical History:  Procedure Laterality Date  . KNEE ARTHROSCOPY     Right       Family History  Problem Relation Age of Onset  . Cancer Father   . Heart disease Brother     Social History   Tobacco Use  . Smoking status: Never Smoker  . Smokeless tobacco: Never Used  Substance Use Topics  . Alcohol use: Yes    Alcohol/week: 0.0 standard drinks    Comment: 1-2 per week  . Drug use: No    Home Medications Prior to Admission medications   Medication Sig Start Date End Date Taking? Authorizing Provider  allopurinol (ZYLOPRIM) 300 MG tablet Take 150 mg by mouth daily.     [provider]  azithromycin (ZITHROMAX) 250 MG tablet Take 2 tabs PO x 1 dose, then 1 tab PO QD x 4 days Patient not taking: Reported on 08/03/2015 07/27/15   Collene Gobble, MD  benzonatate (TESSALON) 100 MG capsule Take 1-2 capsules (100-200 mg total) by mouth 3 (three) times daily as needed for cough. Patient not taking: Reported on 09/06/2016 08/03/15   Collene Gobble, MD  ciprofloxacin (CIPRO) 500 MG tablet Take 1 tablet (500 mg total) by mouth every 12  (twelve) hours. 09/06/16   Arthor Captain, PA-C  ibuprofen (ADVIL,MOTRIN) 200 MG tablet Take 200 mg by mouth every 6 (six) hours as needed for moderate pain.    [provider]  irbesartan-hydrochlorothiazide (AVALIDE) 150-12.5 MG tablet TK 1 T PO QD 06/03/15   [provider]  metroNIDAZOLE (FLAGYL) 500 MG tablet Take 1 tablet (500 mg total) by mouth 2 (two) times daily. One po bid x 7 days 09/06/16   Arthor Captain, PA-C  ondansetron (ZOFRAN) 4 MG tablet Take 1 tablet (4 mg total) by mouth every 8 (eight) hours as needed for nausea or vomiting. 09/06/16   Arthor Captain, PA-C  ranitidine (ZANTAC) 150 MG tablet Take 1 tablet (150 mg total) by mouth 2 (two) times daily. Patient not taking: Reported on 09/06/2016 08/03/15   Collene Gobble, MD  rosuvastatin (CRESTOR) 10 MG tablet Take 10 mg by mouth every morning.     [provider]  testosterone (ANDROGEL) 50 MG/5GM GEL Place 5 g onto the skin daily. Applies to shoulders and upper arms    [provider]    Allergies    Patient has no known allergies.  Review of Systems   Review of Systems  Constitutional: Negative for fever.  Gastrointestinal: Negative for nausea and vomiting.  Musculoskeletal: Negative for neck pain.  Skin: Positive for wound.  Neurological:  Negative for headaches.    Physical Exam Updated Vital Signs BP (!) 161/99 (BP Location: Left Arm)   Pulse 60   Temp 98 F (36.7 C) (Oral)   Resp 16   SpO2 96%   Physical Exam Vitals and nursing note reviewed.  Constitutional:      Appearance: Normal appearance. He is well-developed.  HENT:     Head:     Comments: 1 cm laceration to right forehead. No fb seen or felt.     Nose: Nose normal.     Mouth/Throat:     Mouth: Mucous membranes are moist.  Eyes:     General: No scleral icterus.    Conjunctiva/sclera: Conjunctivae normal.     Pupils: Pupils are equal, round, and reactive to light.  Neck:     Trachea: No tracheal deviation.    Cardiovascular:     Rate and Rhythm: Normal rate.     Pulses: Normal pulses.  Pulmonary:     Effort: Pulmonary effort is normal. No accessory muscle usage or respiratory distress.  Abdominal:     General: Bowel sounds are normal.  Genitourinary:    Comments: No cva tenderness. Musculoskeletal:        General: No swelling.     Cervical back: Normal range of motion and neck supple. No rigidity.     Comments: c spine non tender, aligned.   Skin:    General: Skin is warm and dry.     Findings: No rash.  Neurological:     Mental Status: He is alert.     Comments: Alert, speech clear. GCS 15. Steady gait.  Psychiatric:        Mood and Affect: Mood normal.     ED Results / Procedures / Treatments   Labs (all labs ordered are listed, but only abnormal results are displayed) Labs Reviewed - No data to display  EKG None  Radiology No results found.  Procedures .Marland KitchenLaceration Repair  Date/Time: 02/01/2020 7:00 PM Performed by: Cathren Laine, MD Authorized by: Cathren Laine, MD   Consent:    Consent given by:  Patient Anesthesia (see MAR for exact dosages):    Anesthesia method:  Local infiltration   Local anesthetic:  Lidocaine 2% WITH epi Laceration details:    Location:  Face   Face location:  Forehead   Length (cm):  1 Repair type:    Repair type:  Simple Pre-procedure details:    Preparation:  Patient was prepped and draped in usual sterile fashion Treatment:    Area cleansed with:  Saline   Amount of cleaning:  Standard   Irrigation solution:  Sterile saline   Visualized foreign bodies/material removed: no   Skin repair:    Repair method:  Tissue adhesive and sutures   Suture size:  5-0   Wound skin closure material used: rapid vicryl.   Suture technique:  Simple interrupted   Number of sutures:  2 Post-procedure details:    Patient tolerance of procedure:  Tolerated well, no immediate complications   (including critical care time)  Medications Ordered  in ED Medications  Tdap (BOOSTRIX) injection 0.5 mL (has no administration in time range)    ED Course  I have reviewed the triage vital signs and the nursing notes.  Pertinent labs & imaging results that were available during my care of the patient were reviewed by me and considered in my medical decision making (see chart for details).    MDM Rules/Calculators/A&P  Tetanus im. Wound repaired - see procedure note.   Reviewed nursing notes and prior charts for additional history.   Patient appears stable for d/c.   Return precautions provided.    Final Clinical Impression(s) / ED Diagnoses Final diagnoses:  None    Rx / DC Orders ED Discharge Orders    None           Cathren Laine, MD 02/01/20 [phone]

## 2020-04-06 DIAGNOSIS — M170 Bilateral primary osteoarthritis of knee: Secondary | ICD-10-CM | POA: Diagnosis not present

## 2020-04-29 DIAGNOSIS — M109 Gout, unspecified: Secondary | ICD-10-CM | POA: Diagnosis not present

## 2020-05-04 DIAGNOSIS — Z0000 Encounter for general adult medical examination without abnormal findings: Secondary | ICD-10-CM | POA: Diagnosis not present

## 2020-05-04 DIAGNOSIS — E291 Testicular hypofunction: Secondary | ICD-10-CM | POA: Diagnosis not present

## 2020-05-04 DIAGNOSIS — M109 Gout, unspecified: Secondary | ICD-10-CM | POA: Diagnosis not present

## 2020-05-04 DIAGNOSIS — E1169 Type 2 diabetes mellitus with other specified complication: Secondary | ICD-10-CM | POA: Diagnosis not present

## 2020-05-04 DIAGNOSIS — E7800 Pure hypercholesterolemia, unspecified: Secondary | ICD-10-CM | POA: Diagnosis not present

## 2020-05-04 DIAGNOSIS — I10 Essential (primary) hypertension: Secondary | ICD-10-CM | POA: Diagnosis not present

## 2020-05-04 DIAGNOSIS — Z1389 Encounter for screening for other disorder: Secondary | ICD-10-CM | POA: Diagnosis not present

## 2020-06-14 DIAGNOSIS — M170 Bilateral primary osteoarthritis of knee: Secondary | ICD-10-CM | POA: Diagnosis not present

## 2020-06-21 DIAGNOSIS — M170 Bilateral primary osteoarthritis of knee: Secondary | ICD-10-CM | POA: Diagnosis not present

## 2020-06-28 DIAGNOSIS — M170 Bilateral primary osteoarthritis of knee: Secondary | ICD-10-CM | POA: Diagnosis not present

## 2020-08-02 DIAGNOSIS — M170 Bilateral primary osteoarthritis of knee: Secondary | ICD-10-CM | POA: Diagnosis not present

## 2020-09-02 ENCOUNTER — Ambulatory Visit (HOSPITAL_COMMUNITY)
Admission: EM | Admit: 2020-09-02 | Discharge: 2020-09-02 | Disposition: A | Discharge status: Home / Self Care | Payer: Medicare PPO | Attending: Internal Medicine | Admitting: Internal Medicine

## 2020-09-02 ENCOUNTER — Encounter (HOSPITAL_COMMUNITY): Payer: Self-pay | Admitting: Emergency Medicine

## 2020-09-02 ENCOUNTER — Other Ambulatory Visit: Payer: Self-pay

## 2020-09-02 DIAGNOSIS — R059 Cough, unspecified: Secondary | ICD-10-CM | POA: Diagnosis not present

## 2020-09-02 HISTORY — DX: Gout, unspecified: M10.9

## 2020-09-02 MED ORDER — BENZONATATE 100 MG PO CAPS: 100.0000 mg | ORAL_CAPSULE | Freq: Three times a day (TID) | ORAL | 0 refills | Status: DC

## 2020-09-02 NOTE — Discharge Instructions (Addendum)
Medications as tolerated If symptoms worsen please return to urgent care to be reevaluated.

## 2020-09-02 NOTE — ED Triage Notes (Addendum)
Cough for one week.   Reports intermittent phlegm, clear.  Denies fever.  Intermittent runny nose

## 2020-09-03 NOTE — ED Provider Notes (Signed)
[facility]    CSN: [id number] Arrival date & time: 09/02/20  1456      History   Chief Complaint Chief Complaint  Patient presents with  . Cough    HPI Shawn [name] is a 80 y.o. male comes to the urgent care with 1 week history of nonproductive cough.  Cough started 1 week ago and not associated with fever or chills.  He denies any shortness of breath, chest tightness or wheezing.  No nausea or vomiting.  Patient has had intermittent runny nose.  No itchy eyes or itchy nose.  No rash in the skin.  No history of seasonal allergies.   HPI  Past Medical History:  Diagnosis Date  . Arthritis   . Diverticulitis   . Gout   . High cholesterol     Patient Active Problem List   Diagnosis Date Noted  . OSA (obstructive sleep apnea) 04/28/2014    Past Surgical History:  Procedure Laterality Date  . KNEE ARTHROSCOPY     Right       Home Medications    Prior to Admission medications   Medication Sig Start Date End Date Taking? Authorizing Provider  allopurinol (ZYLOPRIM) 300 MG tablet Take 150 mg by mouth daily.    Yes [provider]  benzonatate (TESSALON) 100 MG capsule Take 1 capsule (100 mg total) by mouth every 8 (eight) hours. 09/02/20  Yes Tkeya Stencil, Britta Mccreedy, MD  irbesartan-hydrochlorothiazide (AVALIDE) 150-12.5 MG tablet TK 1 T PO QD 06/03/15  Yes [provider]  rosuvastatin (CRESTOR) 10 MG tablet Take 10 mg by mouth every morning.    Yes [provider]  ibuprofen (ADVIL,MOTRIN) 200 MG tablet Take 200 mg by mouth every 6 (six) hours as needed for moderate pain.    [provider]  metroNIDAZOLE (FLAGYL) 500 MG tablet Take 1 tablet (500 mg total) by mouth 2 (two) times daily. One po bid x 7 days 09/06/16   Arthor Captain, PA-C  ondansetron (ZOFRAN) 4 MG tablet Take 1 tablet (4 mg total) by mouth every 8 (eight) hours as needed for nausea or vomiting. 09/06/16   Arthor Captain, PA-C  testosterone (ANDROGEL) 50 MG/5GM  GEL Place 5 g onto the skin daily. Applies to shoulders and upper arms    [provider]  ranitidine (ZANTAC) 150 MG tablet Take 1 tablet (150 mg total) by mouth 2 (two) times daily. Patient not taking: Reported on 09/06/2016 08/03/15 09/02/20  Collene Gobble, MD    Family History Family History  Problem Relation Age of Onset  . Cancer Father   . Heart disease Brother     Social History Social History   Tobacco Use  . Smoking status: Never Smoker  . Smokeless tobacco: Never Used  Substance Use Topics  . Alcohol use: Yes    Alcohol/week: 0.0 standard drinks    Comment: 1-2 per week  . Drug use: No     Allergies   Patient has no known allergies.   Review of Systems Review of Systems  Constitutional: Negative.   HENT: Positive for congestion and rhinorrhea. Negative for sore throat.   Respiratory: Positive for cough. Negative for apnea and wheezing.   Cardiovascular: Negative.   Gastrointestinal: Negative.      Physical Exam Triage Vital Signs ED Triage Vitals  Enc Vitals Group     BP 09/02/20 1607 (!) 155/58     Pulse Rate 09/02/20 1607 88     Resp 09/02/20 1607 18  Temp 09/02/20 1607 99 F (37.2 C)     Temp Source 09/02/20 1607 Oral     SpO2 09/02/20 1607 97 %     Weight --      Height --      Head Circumference --      Peak Flow --      Pain Score 09/02/20 1721 0     Pain Loc --      Pain Edu? --      Excl. in GC? --    No data found.  Updated Vital Signs BP (!) 155/58 (BP Location: Right Arm)   Pulse 88   Temp 99 F (37.2 C) (Oral)   Resp 18   SpO2 97%   Visual Acuity Right Eye Distance:   Left Eye Distance:   Bilateral Distance:    Right Eye Near:   Left Eye Near:    Bilateral Near:     Physical Exam Vitals and nursing note reviewed.  Constitutional:      General: He is not in acute distress.    Appearance: He is not ill-appearing.  Cardiovascular:     Rate and Rhythm: Normal rate and regular rhythm.     Pulses: Normal  pulses.     Heart sounds: Normal heart sounds.  Pulmonary:     Effort: Pulmonary effort is normal.     Breath sounds: Normal breath sounds.  Abdominal:     General: Bowel sounds are normal.     Palpations: Abdomen is soft.  Musculoskeletal:        General: Normal range of motion.  Neurological:     Mental Status: He is alert.      UC Treatments / Results  Labs (all labs ordered are listed, but only abnormal results are displayed) Labs Reviewed - No data to display  EKG   Radiology No results found.  Procedures Procedures (including critical care time)  Medications Ordered in UC Medications - No data to display  Initial Impression / Assessment and Plan / UC Course  I have reviewed the triage vital signs and the nursing notes.  Pertinent labs & imaging results that were available during my care of the patient were reviewed by me and considered in my medical decision making (see chart for details).     1.  Cough: Tessalon Perles as needed for cough This is likely related to your recent URI symptoms a couple of weeks to a few weeks ago.  Continue taking Tessalon Perles as needed for cough and stay hydrated.  Return to urgent care if you have worsening symptoms. Final Clinical Impressions(s) / UC Diagnoses   Final diagnoses:  Cough     Discharge Instructions     Medications as tolerated If symptoms worsen please return to urgent care to be reevaluated.    ED Prescriptions    Medication Sig Dispense Auth. Provider   benzonatate (TESSALON) 100 MG capsule Take 1 capsule (100 mg total) by mouth every 8 (eight) hours. 21 capsule Temara Lanum, Britta Mccreedy, MD     PDMP not reviewed this encounter.   Merrilee Jansky, MD 09/03/20 1357

## 2020-09-20 DIAGNOSIS — M170 Bilateral primary osteoarthritis of knee: Secondary | ICD-10-CM | POA: Diagnosis not present

## 2020-09-29 DIAGNOSIS — E8770 Fluid overload, unspecified: Secondary | ICD-10-CM | POA: Diagnosis not present

## 2020-09-29 DIAGNOSIS — R600 Localized edema: Secondary | ICD-10-CM | POA: Diagnosis not present

## 2020-09-29 DIAGNOSIS — R9431 Abnormal electrocardiogram [ECG] [EKG]: Secondary | ICD-10-CM | POA: Diagnosis not present

## 2020-09-29 DIAGNOSIS — I10 Essential (primary) hypertension: Secondary | ICD-10-CM | POA: Diagnosis not present

## 2020-09-29 DIAGNOSIS — E1169 Type 2 diabetes mellitus with other specified complication: Secondary | ICD-10-CM | POA: Diagnosis not present

## 2020-09-29 DIAGNOSIS — M170 Bilateral primary osteoarthritis of knee: Secondary | ICD-10-CM | POA: Diagnosis not present

## 2020-09-29 DIAGNOSIS — E7800 Pure hypercholesterolemia, unspecified: Secondary | ICD-10-CM | POA: Diagnosis not present

## 2020-10-01 DIAGNOSIS — R9431 Abnormal electrocardiogram [ECG] [EKG]: Secondary | ICD-10-CM | POA: Diagnosis not present

## 2020-10-04 DIAGNOSIS — M170 Bilateral primary osteoarthritis of knee: Secondary | ICD-10-CM | POA: Diagnosis not present

## 2020-10-07 DIAGNOSIS — M1712 Unilateral primary osteoarthritis, left knee: Secondary | ICD-10-CM | POA: Diagnosis not present

## 2020-10-14 DIAGNOSIS — M1712 Unilateral primary osteoarthritis, left knee: Secondary | ICD-10-CM | POA: Diagnosis not present

## 2020-10-14 DIAGNOSIS — Z96652 Presence of left artificial knee joint: Secondary | ICD-10-CM | POA: Diagnosis not present

## 2020-10-14 DIAGNOSIS — G8918 Other acute postprocedural pain: Secondary | ICD-10-CM | POA: Diagnosis not present

## 2020-10-16 DIAGNOSIS — K5900 Constipation, unspecified: Secondary | ICD-10-CM | POA: Diagnosis not present

## 2020-10-16 DIAGNOSIS — I10 Essential (primary) hypertension: Secondary | ICD-10-CM | POA: Diagnosis not present

## 2020-10-16 DIAGNOSIS — Z7982 Long term (current) use of aspirin: Secondary | ICD-10-CM | POA: Diagnosis not present

## 2020-10-16 DIAGNOSIS — Z471 Aftercare following joint replacement surgery: Secondary | ICD-10-CM | POA: Diagnosis not present

## 2020-10-16 DIAGNOSIS — Z96652 Presence of left artificial knee joint: Secondary | ICD-10-CM | POA: Diagnosis not present

## 2020-10-16 DIAGNOSIS — M1711 Unilateral primary osteoarthritis, right knee: Secondary | ICD-10-CM | POA: Diagnosis not present

## 2020-10-19 DIAGNOSIS — Z96652 Presence of left artificial knee joint: Secondary | ICD-10-CM | POA: Diagnosis not present

## 2020-10-19 DIAGNOSIS — Z471 Aftercare following joint replacement surgery: Secondary | ICD-10-CM | POA: Diagnosis not present

## 2020-10-19 DIAGNOSIS — I10 Essential (primary) hypertension: Secondary | ICD-10-CM | POA: Diagnosis not present

## 2020-10-19 DIAGNOSIS — Z7982 Long term (current) use of aspirin: Secondary | ICD-10-CM | POA: Diagnosis not present

## 2020-10-19 DIAGNOSIS — M1711 Unilateral primary osteoarthritis, right knee: Secondary | ICD-10-CM | POA: Diagnosis not present

## 2020-10-19 DIAGNOSIS — K5900 Constipation, unspecified: Secondary | ICD-10-CM | POA: Diagnosis not present

## 2020-10-20 DIAGNOSIS — Z471 Aftercare following joint replacement surgery: Secondary | ICD-10-CM | POA: Diagnosis not present

## 2020-10-20 DIAGNOSIS — Z96652 Presence of left artificial knee joint: Secondary | ICD-10-CM | POA: Diagnosis not present

## 2020-10-20 DIAGNOSIS — I10 Essential (primary) hypertension: Secondary | ICD-10-CM | POA: Diagnosis not present

## 2020-10-20 DIAGNOSIS — Z7982 Long term (current) use of aspirin: Secondary | ICD-10-CM | POA: Diagnosis not present

## 2020-10-20 DIAGNOSIS — K5900 Constipation, unspecified: Secondary | ICD-10-CM | POA: Diagnosis not present

## 2020-10-20 DIAGNOSIS — M1711 Unilateral primary osteoarthritis, right knee: Secondary | ICD-10-CM | POA: Diagnosis not present

## 2020-10-21 DIAGNOSIS — Z7982 Long term (current) use of aspirin: Secondary | ICD-10-CM | POA: Diagnosis not present

## 2020-10-21 DIAGNOSIS — Z96652 Presence of left artificial knee joint: Secondary | ICD-10-CM | POA: Diagnosis not present

## 2020-10-21 DIAGNOSIS — M1711 Unilateral primary osteoarthritis, right knee: Secondary | ICD-10-CM | POA: Diagnosis not present

## 2020-10-21 DIAGNOSIS — Z471 Aftercare following joint replacement surgery: Secondary | ICD-10-CM | POA: Diagnosis not present

## 2020-10-21 DIAGNOSIS — I10 Essential (primary) hypertension: Secondary | ICD-10-CM | POA: Diagnosis not present

## 2020-10-21 DIAGNOSIS — K5900 Constipation, unspecified: Secondary | ICD-10-CM | POA: Diagnosis not present

## 2020-10-22 DIAGNOSIS — I10 Essential (primary) hypertension: Secondary | ICD-10-CM | POA: Diagnosis not present

## 2020-10-22 DIAGNOSIS — Z7982 Long term (current) use of aspirin: Secondary | ICD-10-CM | POA: Diagnosis not present

## 2020-10-22 DIAGNOSIS — Z96652 Presence of left artificial knee joint: Secondary | ICD-10-CM | POA: Diagnosis not present

## 2020-10-22 DIAGNOSIS — K5900 Constipation, unspecified: Secondary | ICD-10-CM | POA: Diagnosis not present

## 2020-10-22 DIAGNOSIS — Z471 Aftercare following joint replacement surgery: Secondary | ICD-10-CM | POA: Diagnosis not present

## 2020-10-22 DIAGNOSIS — M1711 Unilateral primary osteoarthritis, right knee: Secondary | ICD-10-CM | POA: Diagnosis not present

## 2020-10-25 DIAGNOSIS — Z471 Aftercare following joint replacement surgery: Secondary | ICD-10-CM | POA: Diagnosis not present

## 2020-10-25 DIAGNOSIS — Z96652 Presence of left artificial knee joint: Secondary | ICD-10-CM | POA: Diagnosis not present

## 2020-10-27 DIAGNOSIS — Z96652 Presence of left artificial knee joint: Secondary | ICD-10-CM | POA: Diagnosis not present

## 2020-11-02 DIAGNOSIS — Z96652 Presence of left artificial knee joint: Secondary | ICD-10-CM | POA: Diagnosis not present

## 2020-11-04 DIAGNOSIS — Z96652 Presence of left artificial knee joint: Secondary | ICD-10-CM | POA: Diagnosis not present

## 2020-11-09 DIAGNOSIS — Z96652 Presence of left artificial knee joint: Secondary | ICD-10-CM | POA: Diagnosis not present

## 2020-11-11 DIAGNOSIS — Z96652 Presence of left artificial knee joint: Secondary | ICD-10-CM | POA: Diagnosis not present

## 2020-11-15 DIAGNOSIS — Z96652 Presence of left artificial knee joint: Secondary | ICD-10-CM | POA: Diagnosis not present

## 2021-01-17 DIAGNOSIS — M1711 Unilateral primary osteoarthritis, right knee: Secondary | ICD-10-CM | POA: Diagnosis not present

## 2021-01-17 DIAGNOSIS — Z471 Aftercare following joint replacement surgery: Secondary | ICD-10-CM | POA: Diagnosis not present

## 2021-01-17 DIAGNOSIS — Z96652 Presence of left artificial knee joint: Secondary | ICD-10-CM | POA: Diagnosis not present

## 2021-02-01 DIAGNOSIS — Z8249 Family history of ischemic heart disease and other diseases of the circulatory system: Secondary | ICD-10-CM | POA: Diagnosis not present

## 2021-02-01 DIAGNOSIS — I10 Essential (primary) hypertension: Secondary | ICD-10-CM | POA: Diagnosis not present

## 2021-02-01 DIAGNOSIS — E785 Hyperlipidemia, unspecified: Secondary | ICD-10-CM | POA: Diagnosis not present

## 2021-02-01 DIAGNOSIS — M109 Gout, unspecified: Secondary | ICD-10-CM | POA: Diagnosis not present

## 2021-02-01 DIAGNOSIS — R600 Localized edema: Secondary | ICD-10-CM | POA: Diagnosis not present

## 2021-02-01 DIAGNOSIS — Z809 Family history of malignant neoplasm, unspecified: Secondary | ICD-10-CM | POA: Diagnosis not present

## 2021-02-01 DIAGNOSIS — M1990 Unspecified osteoarthritis, unspecified site: Secondary | ICD-10-CM | POA: Diagnosis not present

## 2021-02-01 DIAGNOSIS — Z6833 Body mass index (BMI) 33.0-33.9, adult: Secondary | ICD-10-CM | POA: Diagnosis not present

## 2021-02-01 DIAGNOSIS — E669 Obesity, unspecified: Secondary | ICD-10-CM | POA: Diagnosis not present

## 2021-05-16 DIAGNOSIS — Z1389 Encounter for screening for other disorder: Secondary | ICD-10-CM | POA: Diagnosis not present

## 2021-05-16 DIAGNOSIS — E1169 Type 2 diabetes mellitus with other specified complication: Secondary | ICD-10-CM | POA: Diagnosis not present

## 2021-05-16 DIAGNOSIS — E291 Testicular hypofunction: Secondary | ICD-10-CM | POA: Diagnosis not present

## 2021-05-16 DIAGNOSIS — Z0000 Encounter for general adult medical examination without abnormal findings: Secondary | ICD-10-CM | POA: Diagnosis not present

## 2021-05-16 DIAGNOSIS — Z23 Encounter for immunization: Secondary | ICD-10-CM | POA: Diagnosis not present

## 2021-05-16 DIAGNOSIS — N1831 Chronic kidney disease, stage 3a: Secondary | ICD-10-CM | POA: Diagnosis not present

## 2021-05-16 DIAGNOSIS — E7800 Pure hypercholesterolemia, unspecified: Secondary | ICD-10-CM | POA: Diagnosis not present

## 2021-05-16 DIAGNOSIS — I10 Essential (primary) hypertension: Secondary | ICD-10-CM | POA: Diagnosis not present

## 2021-09-09 ENCOUNTER — Encounter (HOSPITAL_BASED_OUTPATIENT_CLINIC_OR_DEPARTMENT_OTHER): Payer: Self-pay

## 2021-09-09 ENCOUNTER — Other Ambulatory Visit: Payer: Self-pay

## 2021-09-09 ENCOUNTER — Emergency Department (HOSPITAL_BASED_OUTPATIENT_CLINIC_OR_DEPARTMENT_OTHER)
Admission: EM | Admit: 2021-09-09 | Discharge: 2021-09-09 | Disposition: A | Discharge status: Home / Self Care | Payer: Medicare PPO | Attending: Emergency Medicine | Admitting: Emergency Medicine

## 2021-09-09 DIAGNOSIS — K5792 Diverticulitis of intestine, part unspecified, without perforation or abscess without bleeding: Secondary | ICD-10-CM | POA: Insufficient documentation

## 2021-09-09 DIAGNOSIS — R1032 Left lower quadrant pain: Secondary | ICD-10-CM | POA: Diagnosis present

## 2021-09-09 DIAGNOSIS — D72829 Elevated white blood cell count, unspecified: Secondary | ICD-10-CM | POA: Diagnosis not present

## 2021-09-09 DIAGNOSIS — R9431 Abnormal electrocardiogram [ECG] [EKG]: Secondary | ICD-10-CM | POA: Diagnosis not present

## 2021-09-09 HISTORY — DX: Localized edema: R60.0

## 2021-09-09 HISTORY — DX: Edema, unspecified: R60.9

## 2021-09-09 LAB — CBC
HCT: 45.5 % (ref 39.0–52.0)
Hemoglobin: 14.9 g/dL (ref 13.0–17.0)
MCH: 29.7 pg (ref 26.0–34.0)
MCHC: 32.7 g/dL (ref 30.0–36.0)
MCV: 90.6 fL (ref 80.0–100.0)
Platelets: 198 10*3/uL (ref 150–400)
RBC: 5.02 MIL/uL (ref 4.22–5.81)
RDW: 14.4 % (ref 11.5–15.5)
WBC: 12.6 10*3/uL — ABNORMAL HIGH (ref 4.0–10.5)
nRBC: 0 % (ref 0.0–0.2)

## 2021-09-09 LAB — COMPREHENSIVE METABOLIC PANEL
ALT: 14 U/L (ref 0–44)
AST: 18 U/L (ref 15–41)
Albumin: 4 g/dL (ref 3.5–5.0)
Alkaline Phosphatase: 48 U/L (ref 38–126)
Anion gap: 11 (ref 5–15)
BUN: 24 mg/dL — ABNORMAL HIGH (ref 8–23)
CO2: 26 mmol/L (ref 22–32)
Calcium: 9.1 mg/dL (ref 8.9–10.3)
Chloride: 102 mmol/L (ref 98–111)
Creatinine, Ser: 1.26 mg/dL — ABNORMAL HIGH (ref 0.61–1.24)
GFR, Estimated: 58 mL/min — ABNORMAL LOW (ref >60)
Glucose, Bld: 96 mg/dL (ref 70–99)
Potassium: 3.9 mmol/L (ref 3.5–5.1)
Sodium: 139 mmol/L (ref 135–145)
Total Bilirubin: 0.9 mg/dL (ref 0.3–1.2)
Total Protein: 6.8 g/dL (ref 6.5–8.1)

## 2021-09-09 LAB — URINALYSIS, ROUTINE W REFLEX MICROSCOPIC
Bilirubin Urine: NEGATIVE
Glucose, UA: NEGATIVE mg/dL
Hgb urine dipstick: NEGATIVE
Ketones, ur: NEGATIVE mg/dL
Leukocytes,Ua: NEGATIVE
Nitrite: NEGATIVE
Protein, ur: NEGATIVE mg/dL
Specific Gravity, Urine: 1.024 (ref 1.005–1.030)
pH: 5 (ref 5.0–8.0)

## 2021-09-09 LAB — LIPASE, BLOOD: Lipase: 66 U/L — ABNORMAL HIGH (ref 11–51)

## 2021-09-09 MED ORDER — CIPROFLOXACIN HCL 500 MG PO TABS: 500.0000 mg | ORAL_TABLET | Freq: Two times a day (BID) | ORAL | 0 refills | Status: DC

## 2021-09-09 MED ORDER — METRONIDAZOLE 500 MG PO TABS
500.0000 mg | ORAL_TABLET | Freq: Once | ORAL | Status: AC
  Administered 2021-09-09: 500 mg via ORAL
  Filled 2021-09-09: qty 1

## 2021-09-09 MED ORDER — HYDROCODONE-ACETAMINOPHEN 5-325 MG PO TABS: 1.0000 | ORAL_TABLET | Freq: Four times a day (QID) | ORAL | 0 refills | Status: DC | PRN

## 2021-09-09 MED ORDER — METRONIDAZOLE 500 MG PO TABS: 500.0000 mg | ORAL_TABLET | Freq: Two times a day (BID) | ORAL | 0 refills | Status: DC

## 2021-09-09 MED ORDER — CIPROFLOXACIN HCL 500 MG PO TABS
500.0000 mg | ORAL_TABLET | Freq: Once | ORAL | Status: AC
  Administered 2021-09-09: 500 mg via ORAL
  Filled 2021-09-09: qty 1

## 2021-09-09 MED ORDER — HYDROCODONE-ACETAMINOPHEN 5-325 MG PO TABS
1.0000 | ORAL_TABLET | Freq: Once | ORAL | Status: AC
  Administered 2021-09-09: 1 via ORAL
  Filled 2021-09-09: qty 1

## 2021-09-09 NOTE — ED Triage Notes (Signed)
Pt c/o L flank pain that started this AM. Pt has some associated nausea and small frequent BMs. Pt reports symptoms are similar to previous diverticulitis episodes.

## 2021-09-09 NOTE — ED Notes (Signed)
Report/handoff given to Denishia Citro.  

## 2021-09-09 NOTE — Discharge Instructions (Signed)
1.  Start your antibiotics tomorrow morning.  You may take 1 Vicodin tablet every 6 hours if needed for pain.  As soon as possible try to start taking plain Tylenol.  Vicodin can cause constipation. 2.  Try to get a recheck with your doctor within the next 3 to 5 days.  Return to the emergency department immediately if you have sudden worsening pain develop fever vomiting or other concerning symptoms.

## 2021-09-09 NOTE — ED Notes (Signed)
Pt remains awake and alert lying in bed - no acute distress noted.  Pt reports ongoing nonradiating L flank pain; earlier associated with nausea without vomiting and with loose stools x3 earlier in the day.  Pt updated that UA still pending and has been provided with urinal.  Call bell in reach.  Denies any additional questions, concerns, needs at this time.

## 2021-09-09 NOTE — ED Notes (Signed)
Late entry -- Pt lying awake and alert in bed- RR even and unlabored on RA with symmetrical rise and fall of chest.  Cardiac monitoring maintained - NSR with triplet PVCs.  Pt denies pain/sob/other complaints at this time.  No active vomiting at this time.  Visitor at bedside.  Will monitor for acute changes and maintain plan of care.

## 2021-09-09 NOTE — ED Provider Notes (Signed)
[facility] EMERGENCY DEPT Provider Note   CSN: [id number] Arrival date & time: 09/09/21  1659     History  Chief Complaint  Patient presents with   Flank Pain    Shawn [name] is a 81 y.o. male.  HPI Patient reports he has history of several episodes of diverticulitis.  He has never required surgical treatment.  He reports he has responded to antibiotics in the past.  Patient reports he started getting left lower abdominal pain this morning.  He reports it is achy.  He reports it is worse if he presses in the area.  He is not having significant pain at rest.  Pain is not severe with activity.  He has not had any fevers vomiting or diarrhea.  No pain burning urgency with urination.  He reports this does feel similar to prior episodes of diverticulitis.  No cough no shortness of breath no chest pain    Home Medications Prior to Admission medications   Medication Sig Start Date End Date Taking? Authorizing Provider  ciprofloxacin (CIPRO) 500 MG tablet Take 1 tablet (500 mg total) by mouth 2 (two) times daily. 09/09/21  Yes Arby Barrette, MD  HYDROcodone-acetaminophen (NORCO/VICODIN) 5-325 MG tablet Take 1-2 tablets by mouth every 6 (six) hours as needed for moderate pain or severe pain. 09/09/21  Yes Arby Barrette, MD  metroNIDAZOLE (FLAGYL) 500 MG tablet Take 1 tablet (500 mg total) by mouth 2 (two) times daily. One po bid x 7 days 09/09/21  Yes Margi Edmundson, Lebron Conners, MD  allopurinol (ZYLOPRIM) 300 MG tablet Take 150 mg by mouth daily.     [provider]  benzonatate (TESSALON) 100 MG capsule Take 1 capsule (100 mg total) by mouth every 8 (eight) hours. 09/02/20   Lamptey, Britta Mccreedy, MD  ibuprofen (ADVIL,MOTRIN) 200 MG tablet Take 200 mg by mouth every 6 (six) hours as needed for moderate pain.    [provider]  irbesartan-hydrochlorothiazide (AVALIDE) 150-12.5 MG tablet TK 1 T PO QD 06/03/15   [provider]  metroNIDAZOLE (FLAGYL) 500 MG tablet  Take 1 tablet (500 mg total) by mouth 2 (two) times daily. One po bid x 7 days 09/06/16   Arthor Captain, PA-C  ondansetron (ZOFRAN) 4 MG tablet Take 1 tablet (4 mg total) by mouth every 8 (eight) hours as needed for nausea or vomiting. 09/06/16   Arthor Captain, PA-C  rosuvastatin (CRESTOR) 10 MG tablet Take 10 mg by mouth every morning.     [provider]  testosterone (ANDROGEL) 50 MG/5GM GEL Place 5 g onto the skin daily. Applies to shoulders and upper arms    [provider]  ranitidine (ZANTAC) 150 MG tablet Take 1 tablet (150 mg total) by mouth 2 (two) times daily. Patient not taking: Reported on 09/06/2016 08/03/15 09/02/20  Collene Gobble, MD      Allergies    Patient has no known allergies.    Review of Systems   Review of Systems 10 systems reviewed negative except as per HPI Physical Exam Updated Vital Signs BP (!) 150/66   Pulse 84   Temp 99 F (37.2 C) (Oral)   Resp 20   Ht 5\' 6"  (1.676 m)   Wt 93 kg   SpO2 100%   BMI 33.09 kg/m  Physical Exam Constitutional:      Comments: Alert nontoxic no acute distress.  No respiratory distress.  HENT:     Mouth/Throat:     Pharynx: Oropharynx is clear.  Eyes:  Extraocular Movements: Extraocular movements intact.  Cardiovascular:     Rate and Rhythm: Normal rate and regular rhythm.  Pulmonary:     Effort: Pulmonary effort is normal.     Breath sounds: Normal breath sounds.  Abdominal:     Comments: Abdomen soft without guarding.  Patient has moderate discomfort to deep palpation in the left lower quadrant.  No pain or mass in the groin.   Musculoskeletal:        General: No swelling or tenderness. Normal range of motion.     Right lower leg: No edema.     Left lower leg: No edema.  Skin:    General: Skin is warm and dry.  Neurological:     General: No focal deficit present.     Mental Status: He is oriented to person, place, and time.     Motor: No weakness.  Psychiatric:        Mood and Affect:  Mood normal.    ED Results / Procedures / Treatments   Labs (all labs ordered are listed, but only abnormal results are displayed) Labs Reviewed  LIPASE, BLOOD - Abnormal; Notable for the following components:      Result Value   Lipase 66 (*)    All other components within normal limits  COMPREHENSIVE METABOLIC PANEL - Abnormal; Notable for the following components:   BUN 24 (*)    Creatinine, Ser 1.26 (*)    GFR, Estimated 58 (*)    All other components within normal limits  CBC - Abnormal; Notable for the following components:   WBC 12.6 (*)    All other components within normal limits  URINALYSIS, ROUTINE W REFLEX MICROSCOPIC    EKG EKG Interpretation  Date/Time:  Friday Sep 09 2021 18:16:50 EDT Ventricular Rate:  94 PR Interval:  146 QRS Duration: 154 QT Interval:  398 QTC Calculation: 497 R Axis:   -34 Text Interpretation: Sinus rhythm with Premature supraventricular complexes and with frequent Premature ventricular complexes Left axis deviation Right bundle branch block Abnormal ECG No previous ECGs available agree, no old comparison Confirmed by Arby BarrettePfeiffer, Shahil Speegle [phone]([id number]) on 09/09/2021 7:41:02 PM  Radiology No results found.  Procedures Procedures    Medications Ordered in ED Medications  ciprofloxacin (CIPRO) tablet 500 mg (500 mg Oral Given 09/09/21 2321)  metroNIDAZOLE (FLAGYL) tablet 500 mg (500 mg Oral Given 09/09/21 2321)  HYDROcodone-acetaminophen (NORCO/VICODIN) 5-325 MG per tablet 1 tablet (1 tablet Oral Given 09/09/21 2321)    ED Course/ Medical Decision Making/ A&P                           Medical Decision Making Amount and/or Complexity of Data Reviewed Labs: ordered.   Patient presents as outlined with left lower quadrant pain.  Pain is reproducible but without guarding.  Patient has known history of diverticulitis and diverticulosis from historical CT imaging.  Patient reports having about 3 episodes.  At this time patient does have mild  leukocytosis but normal vital signs.  Renal function is at baseline.  Patient is mild leukocytosis of 12.6.  At this time with no signs of sepsis and patient tolerating oral intake without difficulty, will proceed with empiric treatment of diverticulitis.  Clinical picture is consistent and patient has known history of diverticulosis.  Patient has previously taken Cipro Flagyl without adverse effect.  Patient has also had Vicodin without any known adverse effect.  At this time plan will be for outpatient management  with careful return precautions reviewed.        Final Clinical Impression(s) / ED Diagnoses Final diagnoses:  Diverticulitis    Rx / DC Orders ED Discharge Orders          Ordered    ciprofloxacin (CIPRO) 500 MG tablet  2 times daily        09/09/21 2306    metroNIDAZOLE (FLAGYL) 500 MG tablet  2 times daily        09/09/21 2306    HYDROcodone-acetaminophen (NORCO/VICODIN) 5-325 MG tablet  Every 6 hours PRN        09/09/21 2306              Arby Barrette, MD 09/09/21 2335

## 2021-09-16 DIAGNOSIS — K5732 Diverticulitis of large intestine without perforation or abscess without bleeding: Secondary | ICD-10-CM | POA: Diagnosis not present

## 2021-11-11 ENCOUNTER — Emergency Department (HOSPITAL_COMMUNITY)
Admission: EM | Admit: 2021-11-11 | Discharge: 2021-11-11 | Disposition: A | Discharge status: Home / Self Care | Payer: Medicare PPO | Attending: Emergency Medicine | Admitting: Emergency Medicine

## 2021-11-11 ENCOUNTER — Encounter (HOSPITAL_COMMUNITY): Payer: Self-pay | Admitting: Emergency Medicine

## 2021-11-11 ENCOUNTER — Other Ambulatory Visit: Payer: Self-pay

## 2021-11-11 DIAGNOSIS — K5732 Diverticulitis of large intestine without perforation or abscess without bleeding: Secondary | ICD-10-CM | POA: Insufficient documentation

## 2021-11-11 DIAGNOSIS — K5792 Diverticulitis of intestine, part unspecified, without perforation or abscess without bleeding: Secondary | ICD-10-CM

## 2021-11-11 DIAGNOSIS — R1032 Left lower quadrant pain: Secondary | ICD-10-CM | POA: Diagnosis present

## 2021-11-11 LAB — CBC WITH DIFFERENTIAL/PLATELET
Abs Immature Granulocytes: 0.04 10*3/uL (ref 0.00–0.07)
Basophils Absolute: 0 10*3/uL (ref 0.0–0.1)
Basophils Relative: 0 %
Eosinophils Absolute: 0 10*3/uL (ref 0.0–0.5)
Eosinophils Relative: 0 %
HCT: 41.3 % (ref 39.0–52.0)
Hemoglobin: 13.7 g/dL (ref 13.0–17.0)
Immature Granulocytes: 0 %
Lymphocytes Relative: 11 %
Lymphs Abs: 1.3 10*3/uL (ref 0.7–4.0)
MCH: 30.8 pg (ref 26.0–34.0)
MCHC: 33.2 g/dL (ref 30.0–36.0)
MCV: 92.8 fL (ref 80.0–100.0)
Monocytes Absolute: 1 10*3/uL (ref 0.1–1.0)
Monocytes Relative: 8 %
Neutro Abs: 9.4 10*3/uL — ABNORMAL HIGH (ref 1.7–7.7)
Neutrophils Relative %: 81 %
Platelets: 181 10*3/uL (ref 150–400)
RBC: 4.45 MIL/uL (ref 4.22–5.81)
RDW: 15.5 % (ref 11.5–15.5)
WBC: 11.8 10*3/uL — ABNORMAL HIGH (ref 4.0–10.5)
nRBC: 0 % (ref 0.0–0.2)

## 2021-11-11 LAB — COMPREHENSIVE METABOLIC PANEL
ALT: 14 U/L (ref 0–44)
AST: 21 U/L (ref 15–41)
Albumin: 3.8 g/dL (ref 3.5–5.0)
Alkaline Phosphatase: 47 U/L (ref 38–126)
Anion gap: 9 (ref 5–15)
BUN: 22 mg/dL (ref 8–23)
CO2: 24 mmol/L (ref 22–32)
Calcium: 9.1 mg/dL (ref 8.9–10.3)
Chloride: 108 mmol/L (ref 98–111)
Creatinine, Ser: 1.37 mg/dL — ABNORMAL HIGH (ref 0.61–1.24)
GFR, Estimated: 52 mL/min — ABNORMAL LOW (ref >60)
Glucose, Bld: 130 mg/dL — ABNORMAL HIGH (ref 70–99)
Potassium: 4 mmol/L (ref 3.5–5.1)
Sodium: 141 mmol/L (ref 135–145)
Total Bilirubin: 0.9 mg/dL (ref 0.3–1.2)
Total Protein: 7 g/dL (ref 6.5–8.1)

## 2021-11-11 MED ORDER — METRONIDAZOLE 500 MG PO TABS: 500.0000 mg | ORAL_TABLET | Freq: Two times a day (BID) | ORAL | 0 refills | Status: DC

## 2021-11-11 MED ORDER — CIPROFLOXACIN HCL 500 MG PO TABS: 500.0000 mg | ORAL_TABLET | Freq: Two times a day (BID) | ORAL | 0 refills | Status: DC

## 2021-11-11 MED ORDER — ONDANSETRON HCL 4 MG PO TABS: 4.0000 mg | ORAL_TABLET | Freq: Three times a day (TID) | ORAL | 0 refills | Status: DC | PRN

## 2021-11-11 MED ORDER — AMOXICILLIN-POT CLAVULANATE 875-125 MG PO TABS: 1.0000 | ORAL_TABLET | Freq: Once | ORAL | Status: DC

## 2021-11-11 MED ORDER — OXYCODONE HCL 5 MG PO TABS
2.5000 mg | ORAL_TABLET | Freq: Once | ORAL | Status: AC
  Administered 2021-11-11: 2.5 mg via ORAL
  Filled 2021-11-11: qty 1

## 2021-11-11 MED ORDER — ACETAMINOPHEN 500 MG PO TABS
1000.0000 mg | ORAL_TABLET | Freq: Once | ORAL | Status: AC
  Administered 2021-11-11: 1000 mg via ORAL
  Filled 2021-11-11: qty 2

## 2021-11-11 MED ORDER — MORPHINE SULFATE 15 MG PO TABS: 7.5000 mg | ORAL_TABLET | ORAL | 0 refills | Status: DC | PRN

## 2021-11-11 NOTE — ED Provider Triage Note (Signed)
Emergency Medicine Provider Triage Evaluation Note  Nykeem Citro , a 81 y.o. male  was evaluated in triage.  Pt complains of left-sided abdominal pain.  Patient states that symptoms began insidiously yesterday.  He notes symptoms persistent since onset with no relief.  He denies fever, nausea, vomiting, urinary symptoms, hematochezia, melena.  He states that this is very similar to past bouts of diverticulitis..  Review of Systems  Positive: See above Negative:   Physical Exam  BP (!) 147/68 (BP Location: Left Arm)   Pulse 85   Temp 99.7 F (37.6 C) (Oral) Comment: Ibprofen around noon  Resp 20   Ht 5\' 6"  (1.676 m)   Wt 93 kg   SpO2 97%   BMI 33.09 kg/m  Gen:   Awake, no distress   Resp:  Normal effort  MSK:   Moves extremities without difficulty  Other:  Left-sided flank tenderness on exam.  CVA tenderness negative bilaterally.  Patient exhibiting minimal tenderness to left lower quadrant.  No overlying skin abnormality noted.  Medical Decision Making  Medically screening exam initiated at 8:28 PM.  Appropriate orders placed.  Hosie Sharman was informed that the remainder of the evaluation will be completed by another provider, this initial triage assessment does not replace that evaluation, and the importance of remaining in the ED until their evaluation is complete.     Jospeh, Mangel, Peter Garter 11/11/21 2029

## 2021-11-11 NOTE — Discharge Instructions (Addendum)
As you have now had two episodes of diverticulitis very close together it would be a good idea to follow-up with your gastroenterologist.  Please let your family doctor know how you are doing on Monday.  Return for worsening pain fever or inability to eat or drink.  Take tylenol 1000mg (2 extra strength) four times a day.   Then take the pain medicine if you feel like you need it. Narcotics do not help with the pain, they only make you care about it less.  You can become addicted to this, people may break into your house to steal it.  It will constipate you.  If you drive under the influence of this medicine you can get a DUI.

## 2021-11-11 NOTE — ED Triage Notes (Signed)
Pt reports LLQ abdominal pain since yesterday. He states that it feels similar to previous diverticulitis attacks. Denies N/V/D. No blood noted in stool. Low grade fever.

## 2021-11-11 NOTE — ED Notes (Signed)
Urine sample has been collected and sitting in triage nursing station. 

## 2021-11-11 NOTE — ED Provider Notes (Signed)
[facility]-EMERGENCY DEPT Provider Note   CSN: [id number] Arrival date & time: 11/11/21  1955     History  Chief Complaint  Patient presents with   Abdominal Pain    Shawn [name] is a 81 y.o. male.  81 yo M with a chief complaints of left lower quadrant abdominal pain.  The patient has a history of diverticulitis.  This feels similar.  No fevers no nausea no vomiting.  No diarrhea.  No trauma.  No rash.   Abdominal Pain      Home Medications Prior to Admission medications   Medication Sig Start Date End Date Taking? Authorizing Provider  morphine (MSIR) 15 MG tablet Take 0.5 tablets (7.5 mg total) by mouth every 4 (four) hours as needed for severe pain. 11/11/21  Yes Melene Plan, DO  allopurinol (ZYLOPRIM) 300 MG tablet Take 150 mg by mouth daily.     [provider]  benzonatate (TESSALON) 100 MG capsule Take 1 capsule (100 mg total) by mouth every 8 (eight) hours. 09/02/20   Lamptey, Britta Mccreedy, MD  ciprofloxacin (CIPRO) 500 MG tablet Take 1 tablet (500 mg total) by mouth 2 (two) times daily. 11/11/21   Melene Plan, DO  HYDROcodone-acetaminophen (NORCO/VICODIN) 5-325 MG tablet Take 1-2 tablets by mouth every 6 (six) hours as needed for moderate pain or severe pain. 09/09/21   Arby Barrette, MD  ibuprofen (ADVIL,MOTRIN) 200 MG tablet Take 200 mg by mouth every 6 (six) hours as needed for moderate pain.    [provider]  irbesartan-hydrochlorothiazide (AVALIDE) 150-12.5 MG tablet TK 1 T PO QD 06/03/15   [provider]  metroNIDAZOLE (FLAGYL) 500 MG tablet Take 1 tablet (500 mg total) by mouth 2 (two) times daily. One po bid x 7 days 11/11/21   Melene Plan, DO  ondansetron (ZOFRAN) 4 MG tablet Take 1 tablet (4 mg total) by mouth every 8 (eight) hours as needed for nausea or vomiting. 11/11/21   Melene Plan, DO  rosuvastatin (CRESTOR) 10 MG tablet Take 10 mg by mouth every morning.     [provider]  testosterone (ANDROGEL) 50  MG/5GM GEL Place 5 g onto the skin daily. Applies to shoulders and upper arms    [provider]  ranitidine (ZANTAC) 150 MG tablet Take 1 tablet (150 mg total) by mouth 2 (two) times daily. Patient not taking: Reported on 09/06/2016 08/03/15 09/02/20  Collene Gobble, MD      Allergies    Patient has no known allergies.    Review of Systems   Review of Systems  Gastrointestinal:  Positive for abdominal pain.    Physical Exam Updated Vital Signs BP (!) 147/68 (BP Location: Left Arm)   Pulse 85   Temp 99.7 F (37.6 C) (Oral) Comment: Ibprofen around noon  Resp 20   Ht 5\' 6"  (1.676 m)   Wt 93 kg   SpO2 97%   BMI 33.09 kg/m  Physical Exam Vitals and nursing note reviewed.  Constitutional:      Appearance: He is well-developed.  HENT:     Head: Normocephalic and atraumatic.  Eyes:     Pupils: Pupils are equal, round, and reactive to light.  Neck:     Vascular: No JVD.  Cardiovascular:     Rate and Rhythm: Normal rate and regular rhythm.     Heart sounds: No murmur heard.    No friction rub. No gallop.  Pulmonary:     Effort: No respiratory distress.  Breath sounds: No wheezing.  Abdominal:     General: There is no distension.     Tenderness: There is abdominal tenderness. There is no guarding or rebound.     Comments: Very minimal tenderness mostly to the left lower quadrant.  Musculoskeletal:        General: Normal range of motion.     Cervical back: Normal range of motion and neck supple.  Skin:    Coloration: Skin is not pale.     Findings: No rash.  Neurological:     Mental Status: He is alert and oriented to person, place, and time.  Psychiatric:        Behavior: Behavior normal.     ED Results / Procedures / Treatments   Labs (all labs ordered are listed, but only abnormal results are displayed) Labs Reviewed  COMPREHENSIVE METABOLIC PANEL - Abnormal; Notable for the following components:      Result Value   Glucose, Bld 130 (*)     Creatinine, Ser 1.37 (*)    GFR, Estimated 52 (*)    All other components within normal limits  CBC WITH DIFFERENTIAL/PLATELET - Abnormal; Notable for the following components:   WBC 11.8 (*)    Neutro Abs 9.4 (*)    All other components within normal limits    EKG None  Radiology No results found.  Procedures Procedures    Medications Ordered in ED Medications  oxyCODONE (Oxy IR/ROXICODONE) immediate release tablet 2.5 mg (has no administration in time range)  acetaminophen (TYLENOL) tablet 1,000 mg (has no administration in time range)    ED Course/ Medical Decision Making/ A&P                           Medical Decision Making Risk OTC drugs. Prescription drug management.   81 yo M with a chief complaint of left lower quadrant abdominal pain.  This feels exactly like the last time he had diverticulitis.  This was just a few months ago.  Seem to improve with antibiotics and pain medicine.  He is well-appearing and nontoxic.  No significant abdominal discomfort on exam.  We will treat presumptively as diverticulitis.  PCP follow-up.  As this is his second episode in just a couple months I did recommend that he follow-up with his gastroenterologist in the office.  9:55 PM:  I have discussed the diagnosis/risks/treatment options with the patient.  Evaluation and diagnostic testing in the emergency department does not suggest an emergent condition requiring admission or immediate intervention beyond what has been performed at this time.  They will follow up with  PCP. We also discussed returning to the ED immediately if new or worsening sx occur. We discussed the sx which are most concerning (e.g., sudden worsening pain, fever, inability to tolerate by mouth) that necessitate immediate return. Medications administered to the patient during their visit and any new prescriptions provided to the patient are listed below.  Medications given during this visit Medications  oxyCODONE  (Oxy IR/ROXICODONE) immediate release tablet 2.5 mg (has no administration in time range)  acetaminophen (TYLENOL) tablet 1,000 mg (has no administration in time range)     The patient appears reasonably screen and/or stabilized for discharge and I doubt any other medical condition or other [facility] requiring further screening, evaluation, or treatment in the ED at this time prior to discharge.          Final Clinical Impression(s) / ED Diagnoses Final diagnoses:  Diverticulitis    Rx / DC Orders ED Discharge Orders          Ordered    ciprofloxacin (CIPRO) 500 MG tablet  2 times daily        11/11/21 2152    metroNIDAZOLE (FLAGYL) 500 MG tablet  2 times daily        11/11/21 2152    morphine (MSIR) 15 MG tablet  Every 4 hours PRN        11/11/21 2152    ondansetron (ZOFRAN) 4 MG tablet  Every 8 hours PRN        11/11/21 2152              Melene Plan, DO 11/11/21 2155

## 2021-11-15 DIAGNOSIS — E1169 Type 2 diabetes mellitus with other specified complication: Secondary | ICD-10-CM | POA: Diagnosis not present

## 2021-11-15 DIAGNOSIS — K5792 Diverticulitis of intestine, part unspecified, without perforation or abscess without bleeding: Secondary | ICD-10-CM | POA: Diagnosis not present

## 2021-11-15 DIAGNOSIS — E291 Testicular hypofunction: Secondary | ICD-10-CM | POA: Diagnosis not present

## 2021-11-15 DIAGNOSIS — E7800 Pure hypercholesterolemia, unspecified: Secondary | ICD-10-CM | POA: Diagnosis not present

## 2021-11-15 DIAGNOSIS — I10 Essential (primary) hypertension: Secondary | ICD-10-CM | POA: Diagnosis not present

## 2022-02-03 DIAGNOSIS — Z86010 Personal history of colonic polyps: Secondary | ICD-10-CM | POA: Diagnosis not present

## 2022-02-03 DIAGNOSIS — K5732 Diverticulitis of large intestine without perforation or abscess without bleeding: Secondary | ICD-10-CM | POA: Diagnosis not present

## 2022-04-03 DIAGNOSIS — M1711 Unilateral primary osteoarthritis, right knee: Secondary | ICD-10-CM | POA: Diagnosis not present

## 2022-04-03 DIAGNOSIS — M170 Bilateral primary osteoarthritis of knee: Secondary | ICD-10-CM | POA: Diagnosis not present

## 2022-04-03 DIAGNOSIS — M1712 Unilateral primary osteoarthritis, left knee: Secondary | ICD-10-CM | POA: Diagnosis not present

## 2022-04-27 DIAGNOSIS — E1169 Type 2 diabetes mellitus with other specified complication: Secondary | ICD-10-CM | POA: Diagnosis not present

## 2022-04-27 DIAGNOSIS — N1831 Chronic kidney disease, stage 3a: Secondary | ICD-10-CM | POA: Diagnosis not present

## 2022-04-27 DIAGNOSIS — E7800 Pure hypercholesterolemia, unspecified: Secondary | ICD-10-CM | POA: Diagnosis not present

## 2022-04-27 DIAGNOSIS — Z5181 Encounter for therapeutic drug level monitoring: Secondary | ICD-10-CM | POA: Diagnosis not present

## 2022-04-27 DIAGNOSIS — M1990 Unspecified osteoarthritis, unspecified site: Secondary | ICD-10-CM | POA: Diagnosis not present

## 2022-05-03 DIAGNOSIS — K648 Other hemorrhoids: Secondary | ICD-10-CM | POA: Diagnosis not present

## 2022-05-03 DIAGNOSIS — K5520 Angiodysplasia of colon without hemorrhage: Secondary | ICD-10-CM | POA: Diagnosis not present

## 2022-05-03 DIAGNOSIS — K5730 Diverticulosis of large intestine without perforation or abscess without bleeding: Secondary | ICD-10-CM | POA: Diagnosis not present

## 2022-05-03 DIAGNOSIS — Z09 Encounter for follow-up examination after completed treatment for conditions other than malignant neoplasm: Secondary | ICD-10-CM | POA: Diagnosis not present

## 2022-05-03 DIAGNOSIS — D124 Benign neoplasm of descending colon: Secondary | ICD-10-CM | POA: Diagnosis not present

## 2022-05-03 DIAGNOSIS — K5732 Diverticulitis of large intestine without perforation or abscess without bleeding: Secondary | ICD-10-CM | POA: Diagnosis not present

## 2022-05-03 DIAGNOSIS — Z86010 Personal history of colonic polyps: Secondary | ICD-10-CM | POA: Diagnosis not present

## 2022-05-03 DIAGNOSIS — D120 Benign neoplasm of cecum: Secondary | ICD-10-CM | POA: Diagnosis not present

## 2022-05-05 DIAGNOSIS — D124 Benign neoplasm of descending colon: Secondary | ICD-10-CM | POA: Diagnosis not present

## 2022-05-15 DIAGNOSIS — M1731 Unilateral post-traumatic osteoarthritis, right knee: Secondary | ICD-10-CM | POA: Diagnosis not present

## 2022-05-15 DIAGNOSIS — M25661 Stiffness of right knee, not elsewhere classified: Secondary | ICD-10-CM | POA: Diagnosis not present

## 2022-05-15 DIAGNOSIS — R262 Difficulty in walking, not elsewhere classified: Secondary | ICD-10-CM | POA: Diagnosis not present

## 2022-06-05 DIAGNOSIS — M1711 Unilateral primary osteoarthritis, right knee: Secondary | ICD-10-CM | POA: Diagnosis not present

## 2022-06-08 DIAGNOSIS — I77810 Thoracic aortic ectasia: Secondary | ICD-10-CM | POA: Diagnosis not present

## 2022-06-08 DIAGNOSIS — Z0000 Encounter for general adult medical examination without abnormal findings: Secondary | ICD-10-CM | POA: Diagnosis not present

## 2022-06-08 DIAGNOSIS — E7800 Pure hypercholesterolemia, unspecified: Secondary | ICD-10-CM | POA: Diagnosis not present

## 2022-06-08 DIAGNOSIS — I10 Essential (primary) hypertension: Secondary | ICD-10-CM | POA: Diagnosis not present

## 2022-06-08 DIAGNOSIS — N1831 Chronic kidney disease, stage 3a: Secondary | ICD-10-CM | POA: Diagnosis not present

## 2022-06-08 DIAGNOSIS — E1122 Type 2 diabetes mellitus with diabetic chronic kidney disease: Secondary | ICD-10-CM | POA: Diagnosis not present

## 2022-06-15 DIAGNOSIS — M1711 Unilateral primary osteoarthritis, right knee: Secondary | ICD-10-CM | POA: Diagnosis not present

## 2022-06-15 DIAGNOSIS — G8918 Other acute postprocedural pain: Secondary | ICD-10-CM | POA: Diagnosis not present

## 2022-06-15 DIAGNOSIS — Z96651 Presence of right artificial knee joint: Secondary | ICD-10-CM | POA: Diagnosis not present

## 2022-06-16 DIAGNOSIS — Z96651 Presence of right artificial knee joint: Secondary | ICD-10-CM | POA: Diagnosis not present

## 2022-06-16 DIAGNOSIS — Z471 Aftercare following joint replacement surgery: Secondary | ICD-10-CM | POA: Diagnosis not present

## 2022-06-16 DIAGNOSIS — Z9181 History of falling: Secondary | ICD-10-CM | POA: Diagnosis not present

## 2022-06-16 DIAGNOSIS — Z7982 Long term (current) use of aspirin: Secondary | ICD-10-CM | POA: Diagnosis not present

## 2022-06-16 DIAGNOSIS — Z96652 Presence of left artificial knee joint: Secondary | ICD-10-CM | POA: Diagnosis not present

## 2022-06-18 DIAGNOSIS — Z96651 Presence of right artificial knee joint: Secondary | ICD-10-CM | POA: Diagnosis not present

## 2022-06-18 DIAGNOSIS — Z96652 Presence of left artificial knee joint: Secondary | ICD-10-CM | POA: Diagnosis not present

## 2022-06-18 DIAGNOSIS — Z9181 History of falling: Secondary | ICD-10-CM | POA: Diagnosis not present

## 2022-06-18 DIAGNOSIS — Z7982 Long term (current) use of aspirin: Secondary | ICD-10-CM | POA: Diagnosis not present

## 2022-06-18 DIAGNOSIS — Z471 Aftercare following joint replacement surgery: Secondary | ICD-10-CM | POA: Diagnosis not present

## 2022-06-21 DIAGNOSIS — Z96651 Presence of right artificial knee joint: Secondary | ICD-10-CM | POA: Diagnosis not present

## 2022-06-21 DIAGNOSIS — Z471 Aftercare following joint replacement surgery: Secondary | ICD-10-CM | POA: Diagnosis not present

## 2022-06-21 DIAGNOSIS — Z7982 Long term (current) use of aspirin: Secondary | ICD-10-CM | POA: Diagnosis not present

## 2022-06-21 DIAGNOSIS — Z9181 History of falling: Secondary | ICD-10-CM | POA: Diagnosis not present

## 2022-06-21 DIAGNOSIS — Z96652 Presence of left artificial knee joint: Secondary | ICD-10-CM | POA: Diagnosis not present

## 2022-06-23 DIAGNOSIS — Z471 Aftercare following joint replacement surgery: Secondary | ICD-10-CM | POA: Diagnosis not present

## 2022-06-23 DIAGNOSIS — Z96652 Presence of left artificial knee joint: Secondary | ICD-10-CM | POA: Diagnosis not present

## 2022-06-23 DIAGNOSIS — Z96651 Presence of right artificial knee joint: Secondary | ICD-10-CM | POA: Diagnosis not present

## 2022-06-23 DIAGNOSIS — Z7982 Long term (current) use of aspirin: Secondary | ICD-10-CM | POA: Diagnosis not present

## 2022-06-23 DIAGNOSIS — Z9181 History of falling: Secondary | ICD-10-CM | POA: Diagnosis not present

## 2022-06-24 DIAGNOSIS — Z96652 Presence of left artificial knee joint: Secondary | ICD-10-CM | POA: Diagnosis not present

## 2022-06-24 DIAGNOSIS — Z7982 Long term (current) use of aspirin: Secondary | ICD-10-CM | POA: Diagnosis not present

## 2022-06-24 DIAGNOSIS — Z96651 Presence of right artificial knee joint: Secondary | ICD-10-CM | POA: Diagnosis not present

## 2022-06-24 DIAGNOSIS — Z9181 History of falling: Secondary | ICD-10-CM | POA: Diagnosis not present

## 2022-06-24 DIAGNOSIS — Z471 Aftercare following joint replacement surgery: Secondary | ICD-10-CM | POA: Diagnosis not present

## 2022-06-25 DIAGNOSIS — Z9181 History of falling: Secondary | ICD-10-CM | POA: Diagnosis not present

## 2022-06-25 DIAGNOSIS — Z7982 Long term (current) use of aspirin: Secondary | ICD-10-CM | POA: Diagnosis not present

## 2022-06-25 DIAGNOSIS — Z471 Aftercare following joint replacement surgery: Secondary | ICD-10-CM | POA: Diagnosis not present

## 2022-06-25 DIAGNOSIS — Z96652 Presence of left artificial knee joint: Secondary | ICD-10-CM | POA: Diagnosis not present

## 2022-06-25 DIAGNOSIS — Z96651 Presence of right artificial knee joint: Secondary | ICD-10-CM | POA: Diagnosis not present

## 2022-06-26 DIAGNOSIS — M25661 Stiffness of right knee, not elsewhere classified: Secondary | ICD-10-CM | POA: Diagnosis not present

## 2022-06-26 DIAGNOSIS — M1711 Unilateral primary osteoarthritis, right knee: Secondary | ICD-10-CM | POA: Diagnosis not present

## 2022-06-26 DIAGNOSIS — Z96651 Presence of right artificial knee joint: Secondary | ICD-10-CM | POA: Diagnosis not present

## 2022-06-30 DIAGNOSIS — Z96651 Presence of right artificial knee joint: Secondary | ICD-10-CM | POA: Diagnosis not present

## 2022-06-30 DIAGNOSIS — M25661 Stiffness of right knee, not elsewhere classified: Secondary | ICD-10-CM | POA: Diagnosis not present

## 2022-07-03 DIAGNOSIS — M25661 Stiffness of right knee, not elsewhere classified: Secondary | ICD-10-CM | POA: Diagnosis not present

## 2022-07-03 DIAGNOSIS — Z96651 Presence of right artificial knee joint: Secondary | ICD-10-CM | POA: Diagnosis not present

## 2022-07-06 DIAGNOSIS — M25661 Stiffness of right knee, not elsewhere classified: Secondary | ICD-10-CM | POA: Diagnosis not present

## 2022-07-06 DIAGNOSIS — Z96651 Presence of right artificial knee joint: Secondary | ICD-10-CM | POA: Diagnosis not present

## 2022-07-07 DIAGNOSIS — M25661 Stiffness of right knee, not elsewhere classified: Secondary | ICD-10-CM | POA: Diagnosis not present

## 2022-07-07 DIAGNOSIS — Z96651 Presence of right artificial knee joint: Secondary | ICD-10-CM | POA: Diagnosis not present

## 2022-07-10 DIAGNOSIS — Z96651 Presence of right artificial knee joint: Secondary | ICD-10-CM | POA: Diagnosis not present

## 2022-07-10 DIAGNOSIS — M25661 Stiffness of right knee, not elsewhere classified: Secondary | ICD-10-CM | POA: Diagnosis not present

## 2022-07-17 DIAGNOSIS — M25661 Stiffness of right knee, not elsewhere classified: Secondary | ICD-10-CM | POA: Diagnosis not present

## 2022-07-17 DIAGNOSIS — Z96651 Presence of right artificial knee joint: Secondary | ICD-10-CM | POA: Diagnosis not present

## 2022-07-21 DIAGNOSIS — Z96651 Presence of right artificial knee joint: Secondary | ICD-10-CM | POA: Diagnosis not present

## 2022-07-21 DIAGNOSIS — M25661 Stiffness of right knee, not elsewhere classified: Secondary | ICD-10-CM | POA: Diagnosis not present

## 2022-07-24 DIAGNOSIS — M25661 Stiffness of right knee, not elsewhere classified: Secondary | ICD-10-CM | POA: Diagnosis not present

## 2022-07-24 DIAGNOSIS — Z96651 Presence of right artificial knee joint: Secondary | ICD-10-CM | POA: Diagnosis not present

## 2022-08-02 DIAGNOSIS — M25661 Stiffness of right knee, not elsewhere classified: Secondary | ICD-10-CM | POA: Diagnosis not present

## 2022-08-02 DIAGNOSIS — Z96651 Presence of right artificial knee joint: Secondary | ICD-10-CM | POA: Diagnosis not present

## 2022-09-18 DIAGNOSIS — Z96653 Presence of artificial knee joint, bilateral: Secondary | ICD-10-CM | POA: Diagnosis not present

## 2022-12-14 DIAGNOSIS — N1831 Chronic kidney disease, stage 3a: Secondary | ICD-10-CM | POA: Diagnosis not present

## 2022-12-14 DIAGNOSIS — E291 Testicular hypofunction: Secondary | ICD-10-CM | POA: Diagnosis not present

## 2022-12-14 DIAGNOSIS — E7800 Pure hypercholesterolemia, unspecified: Secondary | ICD-10-CM | POA: Diagnosis not present

## 2022-12-14 DIAGNOSIS — E1169 Type 2 diabetes mellitus with other specified complication: Secondary | ICD-10-CM | POA: Diagnosis not present

## 2022-12-29 DIAGNOSIS — I7122 Aneurysm of the aortic arch, without rupture: Secondary | ICD-10-CM | POA: Diagnosis not present

## 2022-12-29 DIAGNOSIS — I10 Essential (primary) hypertension: Secondary | ICD-10-CM | POA: Diagnosis not present

## 2023-01-11 ENCOUNTER — Other Ambulatory Visit (HOSPITAL_COMMUNITY): Payer: Self-pay | Admitting: Internal Medicine

## 2023-01-11 DIAGNOSIS — I509 Heart failure, unspecified: Secondary | ICD-10-CM

## 2023-01-25 ENCOUNTER — Ambulatory Visit (HOSPITAL_COMMUNITY): Payer: Medicare PPO | Attending: Cardiology

## 2023-01-25 DIAGNOSIS — I509 Heart failure, unspecified: Secondary | ICD-10-CM | POA: Insufficient documentation

## 2023-01-25 LAB — ECHOCARDIOGRAM COMPLETE
Area-P 1/2: 3.13 cm2
S' Lateral: 4 cm

## 2023-02-08 NOTE — H&P (View-Only) (Signed)
 Chief Complaint  Patient presents with   New Patient (Initial Visit)    cardiomyopathy   History of Present Illness: 82 yo male with history of CKD stage 3,  gout, HTN, hyperlipidemia and recent finding of cardiomyopathy who is here today as a new consult, referred by Dr. Nehemiah Settle, for the evaluation of LV systolic dysfunction. Echo 01/25/23 with LVEF=40-45% with global hypokinesis. Mild AI. He is on an ARB. He tells me today that he feels well overall. No chest pain or dyspnea. No LE edema. No palpitations.   Primary Care Physician: Renford Dills, MD   Past Medical History:  Diagnosis Date   Arthritis    Cardiac arrhythmia    Chronic kidney disease (CKD), stage III (moderate) (HCC)    Diverticulitis    DM (diabetes mellitus) (HCC)    Gout    High cholesterol    Hypercholesterolemia    Osteoarthritis of both knees    Peripheral edema    Testosterone deficiency in male    Thoracic aortic ectasia (HCC)     Past Surgical History:  Procedure Laterality Date   KNEE ARTHROSCOPY     Right   REPLACEMENT TOTAL KNEE Bilateral     Current Outpatient Medications  Medication Sig Dispense Refill   allopurinol (ZYLOPRIM) 300 MG tablet Take 150 mg by mouth daily.      furosemide (LASIX) 40 MG tablet Take 20 mg by mouth daily.     ibuprofen (ADVIL,MOTRIN) 200 MG tablet Take 200 mg by mouth every 6 (six) hours as needed for moderate pain.     irbesartan-hydrochlorothiazide (AVALIDE) 150-12.5 MG tablet TK 1 T PO QD  3   metoprolol succinate (TOPROL XL) 25 MG 24 hr tablet Take 1 tablet (25 mg total) by mouth daily. 90 tablet 3   rosuvastatin (CRESTOR) 10 MG tablet Take 10 mg by mouth every morning.      testosterone (ANDROGEL) 50 MG/5GM GEL Place 5 g onto the skin daily. Applies to shoulders and upper arms     No current facility-administered medications for this visit.    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  Tobacco Use   Smoking status: Never   Smokeless tobacco: Never  Vaping Use   Vaping status: Never Used  Substance and Sexual Activity   Alcohol use: Yes    Alcohol/week: 0.0 standard drinks of alcohol    Comment: 1-2 per week   Drug use: No   Sexual activity: Not on file  Other Topics Concern   Not on file  Social History Narrative   Not on file   Social Determinants of Health   Financial Resource Strain: Not on file  Food Insecurity: Not on file  Transportation Needs: Not on file  Physical Activity: Not on file  Stress: Not on file  Social Connections: Unknown (08/29/2021)   Received from [facility]   Social Network    Social Network: Not on file  Intimate Partner Violence: Unknown (07/21/2021)   Received from [facility]   HITS    Physically Hurt: Not on file    Insult or Talk Down To: Not on file  Threaten Physical Harm: Not on file    Scream or Curse: Not on file    Family History  Problem Relation Age of Onset   Cancer Father        Lung cancer   Heart disease Brother     Review of Systems:  As stated in the HPI and otherwise negative.   BP 138/84   Pulse 70   Ht 5\' 6"  (1.676 m)   Wt 94.3 kg   SpO2 98%   BMI 33.56 kg/m   Physical Examination: General: Well developed, well nourished, NAD  HEENT: OP clear, mucus membranes moist  SKIN: warm, dry. No rashes. Neuro: No focal deficits  Musculoskeletal: Muscle strength 5/5 all ext  Psychiatric: Mood and affect normal  Neck: No JVD, no carotid bruits, no thyromegaly, no lymphadenopathy.  Lungs:Clear bilaterally, no wheezes, rhonci, crackles Cardiovascular: Regular rate and rhythm. No murmurs, gallops or rubs. Abdomen:Soft. Bowel sounds present. Non-tender.   Extremities: No lower extremity edema. Pulses are 2 + in the bilateral DP/PT.  EKG:  EKG is ordered today. The ekg ordered today demonstrates  EKG Interpretation Date/Time:  Friday February 09 2023 11:37:08 EDT Ventricular Rate:  70 PR Interval:  156 QRS Duration:  98 QT Interval:  400 QTC Calculation: 432 R Axis:   -27  Text Interpretation: Normal sinus rhythm Incomplete right bundle branch block When compared with ECG of 09-Sep-2021 18:16, Premature ventricular complexes are no longer Present Premature supraventricular complexes are no longer Present Incomplete right bundle branch block has replaced Right bundle branch block Confirmed by Verne Carrow [id number]) on 02/09/2023 11:39:32 AM    Echo 01/25/23:  1. Left ventricular ejection fraction, by estimation, is 40 to 45%. The  left ventricle has mildly decreased function. The left ventricle  demonstrates global hypokinesis. Left ventricular diastolic parameters are  consistent with Grade I diastolic  dysfunction (impaired relaxation). The average left ventricular global  longitudinal strain is -14.4 %. The global longitudinal strain is  abnormal.   2. Right ventricular systolic function is normal. The right ventricular  size is normal.   3. The mitral valve is normal in structure. Trivial mitral valve  regurgitation. No evidence of mitral stenosis.   4. The aortic valve is calcified. There is moderate calcification of the  aortic valve. There is moderate thickening of the aortic valve. Aortic  valve regurgitation is mild. Aortic valve sclerosis/calcification is  present, without any evidence of aortic  stenosis.   5. The inferior vena cava is normal in size with greater than 50%  respiratory variability, suggesting right atrial pressure of 3 mmHg.    Recent Labs: No results found for requested labs within last 365 days.   Lipid Panel No results found for: "CHOL", "TRIG", "HDL", "CHOLHDL", "VLDL", "LDLCALC",  "LDLDIRECT"   Wt Readings from Last 3 Encounters:  02/09/23 94.3 kg  11/11/21 93 kg  09/09/21 93 kg    Assessment and Plan:   1. Cardiomyopathy/Chronic diastolic CHF: New finding of reduced LV systolic function. Volume status ok on Lasix 20 mg daily. Will plan right and left heart cath at [facility] on 02/22/23 at noon.  I have reviewed the risks, indications, and alternatives to cardiac catheterization, possible angioplasty, and stenting with the patient. Risks include but are not limited to bleeding, infection, vascular injury, stroke, myocardial infection, arrhythmia, kidney injury, radiation-related injury in the case of prolonged fluoroscopy use, emergency cardiac surgery, and death. The patient understands the risks of serious complication is 1-2 in 1000 with  diagnostic cardiac cath and 1-2% or less with angioplasty/stenting.  Will continue ARB Start Toprol 25 mg daily Pre cath labs today.   Labs/ tests ordered today include:   Orders Placed This Encounter  Procedures   Basic Metabolic Panel (BMET)   CBC   EKG 12-Lead    Disposition:   F/U with me in six months   Signed, Verne Carrow, MD, [facility] 02/09/2023 1:33 PM    [facility] [address] [place], [place], [place]  [address] Phone: [phone]; Fax: [phone]

## 2023-02-08 NOTE — Progress Notes (Signed)
Chief Complaint  Patient presents with   New Patient (Initial Visit)    cardiomyopathy   History of Present Illness: 82 yo male with history of CKD stage 3,  gout, HTN, hyperlipidemia and recent finding of cardiomyopathy who is here today as a new consult, referred by Dr. Nehemiah Settle, for the evaluation of LV systolic dysfunction. Echo 01/25/23 with LVEF=40-45% with global hypokinesis. Mild AI. He is on an ARB. He tells me today that he feels well overall. No chest pain or dyspnea. No LE edema. No palpitations.   Primary Care Physician: Renford Dills, MD   Past Medical History:  Diagnosis Date   Arthritis    Cardiac arrhythmia    Chronic kidney disease (CKD), stage III (moderate) (HCC)    Diverticulitis    DM (diabetes mellitus) (HCC)    Gout    High cholesterol    Hypercholesterolemia    Osteoarthritis of both knees    Peripheral edema    Testosterone deficiency in male    Thoracic aortic ectasia (HCC)     Past Surgical History:  Procedure Laterality Date   KNEE ARTHROSCOPY     Right   REPLACEMENT TOTAL KNEE Bilateral     Current Outpatient Medications  Medication Sig Dispense Refill   allopurinol (ZYLOPRIM) 300 MG tablet Take 150 mg by mouth daily.      furosemide (LASIX) 40 MG tablet Take 20 mg by mouth daily.     ibuprofen (ADVIL,MOTRIN) 200 MG tablet Take 200 mg by mouth every 6 (six) hours as needed for moderate pain.     irbesartan-hydrochlorothiazide (AVALIDE) 150-12.5 MG tablet TK 1 T PO QD  3   metoprolol succinate (TOPROL XL) 25 MG 24 hr tablet Take 1 tablet (25 mg total) by mouth daily. 90 tablet 3   rosuvastatin (CRESTOR) 10 MG tablet Take 10 mg by mouth every morning.      testosterone (ANDROGEL) 50 MG/5GM GEL Place 5 g onto the skin daily. Applies to shoulders and upper arms     No current facility-administered medications for this visit.    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  Tobacco Use   Smoking status: Never   Smokeless tobacco: Never  Vaping Use   Vaping status: Never Used  Substance and Sexual Activity   Alcohol use: Yes    Alcohol/week: 0.0 standard drinks of alcohol    Comment: 1-2 per week   Drug use: No   Sexual activity: Not on file  Other Topics Concern   Not on file  Social History Narrative   Not on file   Social Determinants of Health   Financial Resource Strain: Not on file  Food Insecurity: Not on file  Transportation Needs: Not on file  Physical Activity: Not on file  Stress: Not on file  Social Connections: Unknown (08/29/2021)   Received from [facility]   Social Network    Social Network: Not on file  Intimate Partner Violence: Unknown (07/21/2021)   Received from [facility]   HITS    Physically Hurt: Not on file    Insult or Talk Down To: Not on file  Threaten Physical Harm: Not on file    Scream or Curse: Not on file    Family History  Problem Relation Age of Onset   Cancer Father        Lung cancer   Heart disease Brother     Review of Systems:  As stated in the HPI and otherwise negative.   BP 138/84   Pulse 70   Ht 5\' 6"  (1.676 m)   Wt 94.3 kg   SpO2 98%   BMI 33.56 kg/m   Physical Examination: General: Well developed, well nourished, NAD  HEENT: OP clear, mucus membranes moist  SKIN: warm, dry. No rashes. Neuro: No focal deficits  Musculoskeletal: Muscle strength 5/5 all ext  Psychiatric: Mood and affect normal  Neck: No JVD, no carotid bruits, no thyromegaly, no lymphadenopathy.  Lungs:Clear bilaterally, no wheezes, rhonci, crackles Cardiovascular: Regular rate and rhythm. No murmurs, gallops or rubs. Abdomen:Soft. Bowel sounds present. Non-tender.   Extremities: No lower extremity edema. Pulses are 2 + in the bilateral DP/PT.  EKG:  EKG is ordered today. The ekg ordered today demonstrates  EKG Interpretation Date/Time:  Friday February 09 2023 11:37:08 EDT Ventricular Rate:  70 PR Interval:  156 QRS Duration:  98 QT Interval:  400 QTC Calculation: 432 R Axis:   -27  Text Interpretation: Normal sinus rhythm Incomplete right bundle branch block When compared with ECG of 09-Sep-2021 18:16, Premature ventricular complexes are no longer Present Premature supraventricular complexes are no longer Present Incomplete right bundle branch block has replaced Right bundle branch block Confirmed by Verne Carrow [id number]) on 02/09/2023 11:39:32 AM    Echo 01/25/23:  1. Left ventricular ejection fraction, by estimation, is 40 to 45%. The  left ventricle has mildly decreased function. The left ventricle  demonstrates global hypokinesis. Left ventricular diastolic parameters are  consistent with Grade I diastolic  dysfunction (impaired relaxation). The average left ventricular global  longitudinal strain is -14.4 %. The global longitudinal strain is  abnormal.   2. Right ventricular systolic function is normal. The right ventricular  size is normal.   3. The mitral valve is normal in structure. Trivial mitral valve  regurgitation. No evidence of mitral stenosis.   4. The aortic valve is calcified. There is moderate calcification of the  aortic valve. There is moderate thickening of the aortic valve. Aortic  valve regurgitation is mild. Aortic valve sclerosis/calcification is  present, without any evidence of aortic  stenosis.   5. The inferior vena cava is normal in size with greater than 50%  respiratory variability, suggesting right atrial pressure of 3 mmHg.    Recent Labs: No results found for requested labs within last 365 days.   Lipid Panel No results found for: "CHOL", "TRIG", "HDL", "CHOLHDL", "VLDL", "LDLCALC",  "LDLDIRECT"   Wt Readings from Last 3 Encounters:  02/09/23 94.3 kg  11/11/21 93 kg  09/09/21 93 kg    Assessment and Plan:   1. Cardiomyopathy/Chronic diastolic CHF: New finding of reduced LV systolic function. Volume status ok on Lasix 20 mg daily. Will plan right and left heart cath at [facility] on 02/22/23 at noon.  I have reviewed the risks, indications, and alternatives to cardiac catheterization, possible angioplasty, and stenting with the patient. Risks include but are not limited to bleeding, infection, vascular injury, stroke, myocardial infection, arrhythmia, kidney injury, radiation-related injury in the case of prolonged fluoroscopy use, emergency cardiac surgery, and death. The patient understands the risks of serious complication is 1-2 in 1000 with  diagnostic cardiac cath and 1-2% or less with angioplasty/stenting.  Will continue ARB Start Toprol 25 mg daily Pre cath labs today.   Labs/ tests ordered today include:   Orders Placed This Encounter  Procedures   Basic Metabolic Panel (BMET)   CBC   EKG 12-Lead    Disposition:   F/U with me in six months   Signed, Verne Carrow, MD, [facility] 02/09/2023 1:33 PM    [facility] [address] [place], [place], [place]  [address] Phone: [phone]; Fax: [phone]

## 2023-02-09 ENCOUNTER — Ambulatory Visit: Payer: Medicare PPO | Attending: Cardiovascular Disease | Admitting: Cardiovascular Disease

## 2023-02-09 ENCOUNTER — Encounter: Payer: Self-pay | Admitting: Cardiovascular Disease

## 2023-02-09 VITALS — BP 138/84 | HR 70 | Ht 66.0 in | Wt 207.9 lb

## 2023-02-09 DIAGNOSIS — I420 Dilated cardiomyopathy: Secondary | ICD-10-CM

## 2023-02-09 DIAGNOSIS — Z01812 Encounter for preprocedural laboratory examination: Secondary | ICD-10-CM | POA: Diagnosis not present

## 2023-02-09 DIAGNOSIS — I5032 Chronic diastolic (congestive) heart failure: Secondary | ICD-10-CM

## 2023-02-09 MED ORDER — METOPROLOL SUCCINATE ER 25 MG PO TB24: 25.0000 mg | ORAL_TABLET | Freq: Every day | ORAL | 3 refills | Status: DC

## 2023-02-09 NOTE — Patient Instructions (Addendum)
Medication Instructions:  Your physician has recommended you make the following change in your medication:  1.) start metoprolol succinate (Toprol XL)  25 mg - take one tablet by mouth daily  *If you need a refill on your cardiac medications before your next appointment, please call your pharmacy*   Lab Work: Today: cbc, bmet If you have labs (blood work) drawn today and your tests are completely normal, you will receive your results only by: MyChart Message (if you have MyChart) OR A paper copy in the mail If you have any lab test that is abnormal or we need to change your treatment, we will call you to review the results.   Testing/Procedures: Your physician has requested that you have a cardiac catheterization. Cardiac catheterization is used to diagnose and/or treat various heart conditions. Doctors may recommend this procedure for a number of different reasons. The most common reason is to evaluate chest pain. Chest pain can be a symptom of coronary artery disease (CAD), and cardiac catheterization can show whether plaque is narrowing or blocking your heart's arteries. This procedure is also used to evaluate the valves, as well as measure the blood flow and oxygen levels in different parts of your heart. For further information please visit [email or web address] Please follow instruction sheet, as given.   Follow-Up: At [facility], you and your health needs are our priority.  As part of our continuing mission to provide you with exceptional heart care, we have created designated Provider Care Teams.  These Care Teams include your primary Cardiologist (physician) and Advanced Practice Providers (APPs -  Physician Assistants and Nurse Practitioners) who all work together to provide you with the care you need, when you need it.  We recommend signing up for the patient portal called "MyChart".  Sign up information is provided on this After Visit Summary.  MyChart is used to connect  with patients for Virtual Visits (Telemedicine).  Patients are able to view lab/test results, encounter notes, upcoming appointments, etc.  Non-urgent messages can be sent to your provider as well.   To learn more about what you can do with MyChart, go to [email or web address].    Your next appointment:   4-6  week(s)  Provider:   Jari Favre, PA-C, Ronie Spies, PA-C, Robin Searing, NP, Jacolyn Reedy, PA-C, Eligha Bridegroom, NP, Tereso Newcomer, PA-C, or Perlie Gold, PA-C        Other Instructions       Cardiac/Peripheral Catheterization   You are scheduled for a Cardiac Catheterization on Thursday, November 7 with Dr. Verne Carrow.  1. Please arrive at the [facility] (Main Entrance A) at [facility]: [address] [place], [place] [address] at 10:00 AM (This time is TWO hour(s) before your procedure to ensure your preparation). Free valet parking service is available. You will check in at ADMITTING. The support person will be asked to wait in the waiting room.  It is OK to have someone drop you off and come back when you are ready to be discharged.        Special note: Every effort is made to have your procedure done on time. Please understand that emergencies sometimes delay scheduled procedures.  2. Diet: Do not eat solid foods after midnight.  You may have clear liquids until 5 AM the day of the procedure.  3. Labs: You will need to have blood drawn today.   You do not need to be fasting.  4. Medication instructions in preparation for your  procedure:   Contrast Allergy: No   No Lasix or irbesartan - hydrochlorothiazide on day of procedure   On the morning of your procedure, take Aspirin 81 mg and any morning medicines NOT listed above.  You may use sips of water.  5. Plan to go home the same day, you will only stay overnight if medically necessary. 6. You MUST have a responsible adult to drive you home. 7. An adult MUST be with you the first 24 hours after  you arrive home. 8. Bring a current list of your medications, and the last time and date medication taken. 9. Bring ID and current insurance cards. 10.Please wear clothes that are easy to get on and off and wear slip-on shoes.  Thank you for allowing [place] to care for you!   -- [facility]

## 2023-02-10 LAB — CBC
Hematocrit: 44.7 % (ref 37.5–51.0)
Hemoglobin: 14.5 g/dL (ref 13.0–17.7)
MCH: 30.1 pg (ref 26.6–33.0)
MCHC: 32.4 g/dL (ref 31.5–35.7)
MCV: 93 fL (ref 79–97)
Platelets: 203 10*3/uL (ref 150–450)
RBC: 4.81 x10E6/uL (ref 4.14–5.80)
RDW: 14.3 % (ref 11.6–15.4)
WBC: 7.5 10*3/uL (ref 3.4–10.8)

## 2023-02-10 LAB — BASIC METABOLIC PANEL
BUN/Creatinine Ratio: 17 (ref 10–24)
BUN: 25 mg/dL (ref 8–27)
CO2: 24 mmol/L (ref 20–29)
Calcium: 9.7 mg/dL (ref 8.6–10.2)
Chloride: 102 mmol/L (ref 96–106)
Creatinine, Ser: 1.44 mg/dL — ABNORMAL HIGH (ref 0.76–1.27)
Glucose: 95 mg/dL (ref 70–99)
Potassium: 4.7 mmol/L (ref 3.5–5.2)
Sodium: 142 mmol/L (ref 134–144)
eGFR: 49 mL/min/{1.73_m2} — ABNORMAL LOW (ref >59)

## 2023-02-12 ENCOUNTER — Telehealth: Payer: Self-pay | Admitting: Cardiovascular Disease

## 2023-02-12 NOTE — Telephone Encounter (Signed)
Spoke with the patient's daughter who states that the patient is currently scheduled for a cath on 11/7. He is going out of town from 11/8-11/10 so she would like to reschedule for when he gets back.  I have rescheduled him for 11/12 at 9am. She is aware that he will need to arrive at 7am.

## 2023-02-12 NOTE — Telephone Encounter (Signed)
Follo3 Up:    Patient daughter said she had talked to someone earlier today about a date for patient's Cath. The daughter says 05-02-23 is fine for the Cath, she just needs the time please.

## 2023-02-26 ENCOUNTER — Telehealth: Payer: Self-pay | Admitting: *Deleted

## 2023-02-26 NOTE — Telephone Encounter (Signed)
Cardiac Catheterization scheduled at [facility] for: Tuesday February 27, 2023 9 AM Arrival time [facility] Main Entrance A at: 7 AM  Nothing to eat after midnight prior to procedure, clear liquids until 5 AM day of procedure.  Medication instructions: -Hold:  Lasix/Irbesartan/HCT-day before and day of procedure-per protocol GFR <60 (49) -Other usual morning medications can be taken with sips of water including aspirin 81 mg.  Plan to go home the same day, you will only stay overnight if medically necessary.  You must have responsible adult to drive you home.  Someone must be with you the first 24 hours after you arrive home.  Reviewed procedure instructions with patient.

## 2023-02-27 ENCOUNTER — Other Ambulatory Visit: Payer: Self-pay

## 2023-02-27 ENCOUNTER — Encounter (HOSPITAL_COMMUNITY): Payer: Self-pay | Admitting: Cardiovascular Disease

## 2023-02-27 ENCOUNTER — Ambulatory Visit (HOSPITAL_COMMUNITY)
Admission: RE | Disposition: A | Discharge status: Home / Self Care | Payer: Self-pay | Source: Home / Self Care | Attending: Cardiovascular Disease

## 2023-02-27 ENCOUNTER — Ambulatory Visit (HOSPITAL_COMMUNITY)
Admission: RE | Admit: 2023-02-27 | Discharge: 2023-02-27 | Disposition: A | Discharge status: Home / Self Care | Payer: Medicare PPO | Attending: Cardiovascular Disease | Admitting: Cardiovascular Disease

## 2023-02-27 DIAGNOSIS — I2584 Coronary atherosclerosis due to calcified coronary lesion: Secondary | ICD-10-CM | POA: Insufficient documentation

## 2023-02-27 DIAGNOSIS — I428 Other cardiomyopathies: Secondary | ICD-10-CM | POA: Insufficient documentation

## 2023-02-27 DIAGNOSIS — N183 Chronic kidney disease, stage 3 unspecified: Secondary | ICD-10-CM | POA: Diagnosis not present

## 2023-02-27 DIAGNOSIS — I130 Hypertensive heart and chronic kidney disease with heart failure and stage 1 through stage 4 chronic kidney disease, or unspecified chronic kidney disease: Secondary | ICD-10-CM | POA: Diagnosis not present

## 2023-02-27 DIAGNOSIS — E785 Hyperlipidemia, unspecified: Secondary | ICD-10-CM | POA: Insufficient documentation

## 2023-02-27 DIAGNOSIS — I5032 Chronic diastolic (congestive) heart failure: Secondary | ICD-10-CM | POA: Insufficient documentation

## 2023-02-27 DIAGNOSIS — E1122 Type 2 diabetes mellitus with diabetic chronic kidney disease: Secondary | ICD-10-CM | POA: Diagnosis not present

## 2023-02-27 DIAGNOSIS — M109 Gout, unspecified: Secondary | ICD-10-CM | POA: Insufficient documentation

## 2023-02-27 DIAGNOSIS — I2510 Atherosclerotic heart disease of native coronary artery without angina pectoris: Secondary | ICD-10-CM | POA: Diagnosis not present

## 2023-02-27 DIAGNOSIS — Z79899 Other long term (current) drug therapy: Secondary | ICD-10-CM | POA: Diagnosis not present

## 2023-02-27 HISTORY — PX: RIGHT/LEFT HEART CATH AND CORONARY ANGIOGRAPHY: CATH118266

## 2023-02-27 LAB — POCT I-STAT EG7
Acid-base deficit: 3 mmol/L — ABNORMAL HIGH (ref 0.0–2.0)
Acid-base deficit: 2 mmol/L (ref 0.0–2.0)
Bicarbonate: 22 mmol/L (ref 20.0–28.0)
Bicarbonate: 23.3 mmol/L (ref 20.0–28.0)
Calcium, Ion: 1.15 mmol/L (ref 1.15–1.40)
Calcium, Ion: 1.13 mmol/L — ABNORMAL LOW (ref 1.15–1.40)
HCT: 37 % — ABNORMAL LOW (ref 39.0–52.0)
HCT: 38 % — ABNORMAL LOW (ref 39.0–52.0)
Hemoglobin: 12.6 g/dL — ABNORMAL LOW (ref 13.0–17.0)
Hemoglobin: 12.9 g/dL — ABNORMAL LOW (ref 13.0–17.0)
O2 Saturation: 98 %
O2 Saturation: 74 %
Potassium: 3.7 mmol/L (ref 3.5–5.1)
Potassium: 3.7 mmol/L (ref 3.5–5.1)
Sodium: 141 mmol/L (ref 135–145)
Sodium: 141 mmol/L (ref 135–145)
TCO2: 23 mmol/L (ref 22–32)
TCO2: 25 mmol/L (ref 22–32)
pCO2, Ven: 37.5 mm[Hg] — ABNORMAL LOW (ref 44–60)
pCO2, Ven: 42.4 mm[Hg] — ABNORMAL LOW (ref 44–60)
pH, Ven: 7.376 (ref 7.25–7.43)
pH, Ven: 7.348 (ref 7.25–7.43)
pO2, Ven: 105 mm[Hg] — ABNORMAL HIGH (ref 32–45)
pO2, Ven: 41 mm[Hg] (ref 32–45)

## 2023-02-27 SURGERY — RIGHT/LEFT HEART CATH AND CORONARY ANGIOGRAPHY
Anesthesia: LOCAL

## 2023-02-27 MED ORDER — MIDAZOLAM HCL 2 MG/2ML IJ SOLN
INTRAMUSCULAR | Status: DC | PRN
  Administered 2023-02-27 (×2): 1 mg via INTRAVENOUS

## 2023-02-27 MED ORDER — SODIUM CHLORIDE 0.9 % WEIGHT BASED INFUSION
3.0000 mL/kg/h | INTRAVENOUS | Status: AC
  Administered 2023-02-27: 3 mL/kg/h via INTRAVENOUS

## 2023-02-27 MED ORDER — HEPARIN (PORCINE) IN NACL 1000-0.9 UT/500ML-% IV SOLN
INTRAVENOUS | Status: DC | PRN
  Administered 2023-02-27 (×2): 500 mL

## 2023-02-27 MED ORDER — SODIUM CHLORIDE 0.9% FLUSH: 3.0000 mL | Freq: Two times a day (BID) | INTRAVENOUS | Status: DC

## 2023-02-27 MED ORDER — SODIUM CHLORIDE 0.9 % WEIGHT BASED INFUSION: 1.0000 mL/kg/h | INTRAVENOUS | Status: DC

## 2023-02-27 MED ORDER — LIDOCAINE HCL (PF) 1 % IJ SOLN
INTRAMUSCULAR | Status: AC
  Filled 2023-02-27: qty 30

## 2023-02-27 MED ORDER — ASPIRIN 81 MG PO CHEW: 81.0000 mg | CHEWABLE_TABLET | ORAL | Status: DC

## 2023-02-27 MED ORDER — VERAPAMIL HCL 2.5 MG/ML IV SOLN
INTRAVENOUS | Status: AC
  Filled 2023-02-27: qty 2

## 2023-02-27 MED ORDER — SODIUM CHLORIDE 0.9% FLUSH: 3.0000 mL | INTRAVENOUS | Status: DC | PRN

## 2023-02-27 MED ORDER — ACETAMINOPHEN 325 MG PO TABS: 650.0000 mg | ORAL_TABLET | ORAL | Status: DC | PRN

## 2023-02-27 MED ORDER — LABETALOL HCL 5 MG/ML IV SOLN: 10.0000 mg | INTRAVENOUS | Status: DC | PRN

## 2023-02-27 MED ORDER — ONDANSETRON HCL 4 MG/2ML IJ SOLN: 4.0000 mg | Freq: Four times a day (QID) | INTRAMUSCULAR | Status: DC | PRN

## 2023-02-27 MED ORDER — SODIUM CHLORIDE 0.9 % IV SOLN: 250.0000 mL | INTRAVENOUS | Status: DC | PRN

## 2023-02-27 MED ORDER — HEPARIN SODIUM (PORCINE) 1000 UNIT/ML IJ SOLN
INTRAMUSCULAR | Status: AC
  Filled 2023-02-27: qty 10

## 2023-02-27 MED ORDER — LIDOCAINE HCL (PF) 1 % IJ SOLN
INTRAMUSCULAR | Status: DC | PRN
  Administered 2023-02-27 (×2): 2 mL via INTRADERMAL

## 2023-02-27 MED ORDER — FENTANYL CITRATE (PF) 100 MCG/2ML IJ SOLN
INTRAMUSCULAR | Status: AC
  Filled 2023-02-27: qty 2

## 2023-02-27 MED ORDER — HYDRALAZINE HCL 20 MG/ML IJ SOLN: 10.0000 mg | INTRAMUSCULAR | Status: DC | PRN

## 2023-02-27 MED ORDER — MIDAZOLAM HCL 2 MG/2ML IJ SOLN
INTRAMUSCULAR | Status: AC
  Filled 2023-02-27: qty 2

## 2023-02-27 MED ORDER — SODIUM CHLORIDE 0.9 % IV SOLN: INTRAVENOUS | Status: DC

## 2023-02-27 MED ORDER — FENTANYL CITRATE (PF) 100 MCG/2ML IJ SOLN
INTRAMUSCULAR | Status: DC | PRN
  Administered 2023-02-27 (×2): 25 ug via INTRAVENOUS

## 2023-02-27 SURGICAL SUPPLY — 16 items
CATH 5FR JL3.5 JR4 ANG PIG MP (CATHETERS) IMPLANT
CATH BALLN WEDGE 5F 110CM (CATHETERS) IMPLANT
CATH INFINITI 5FR AL1 (CATHETERS) IMPLANT
CATH INFINITI 5FR MULTPACK ANG (CATHETERS) IMPLANT
CLOSURE MYNX CONTROL 5F (Vascular Products) IMPLANT
DEVICE RAD COMP TR BAND LRG (VASCULAR PRODUCTS) IMPLANT
GLIDESHEATH SLEND SS 6F .021 (SHEATH) IMPLANT
GUIDEWIRE INQWIRE 1.5J.035X260 (WIRE) IMPLANT
INQWIRE 1.5J .035X260CM (WIRE) ×1
KIT MICROPUNCTURE NIT STIFF (SHEATH) IMPLANT
PACK CARDIAC CATHETERIZATION (CUSTOM PROCEDURE TRAY) ×2 IMPLANT
SET ATX-X65L (MISCELLANEOUS) IMPLANT
SHEATH GLIDE SLENDER 4/5FR (SHEATH) IMPLANT
SHEATH PINNACLE 5F 10CM (SHEATH) IMPLANT
WIRE EMERALD 3MM-J .035X150CM (WIRE) IMPLANT
WIRE HI TORQ VERSACORE-J 145CM (WIRE) IMPLANT

## 2023-02-27 NOTE — Discharge Instructions (Signed)

## 2023-02-27 NOTE — Interval H&P Note (Signed)
History and Physical Interval Note:  02/27/2023 8:23 AM  Shawn [name]  has presented today for surgery, with the diagnosis of dilated cardiomyopathy.  The various methods of treatment have been discussed with the patient and family. After consideration of risks, benefits and other options for treatment, the patient has consented to  Procedure(s): RIGHT/LEFT HEART CATH AND CORONARY ANGIOGRAPHY (N/A) as a surgical intervention.  The patient's history has been reviewed, patient examined, no change in status, stable for surgery.  I have reviewed the patient's chart and labs.  Questions were answered to the patient's satisfaction.    Cath Lab Visit (complete for each Cath Lab visit)  Clinical Evaluation Leading to the Procedure:   ACS: No.  Non-ACS:    Anginal Classification: No Symptoms  Anti-ischemic medical therapy: Minimal Therapy (1 class of medications)  Non-Invasive Test Results: No non-invasive testing performed  Prior CABG: No previous CABG        Verne Carrow

## 2023-02-28 MED FILL — Heparin Sodium (Porcine) Inj 1000 Unit/ML: INTRAMUSCULAR | Qty: 10 | Status: AC

## 2023-02-28 MED FILL — Verapamil HCl IV Soln 2.5 MG/ML: INTRAVENOUS | Qty: 2 | Status: AC

## 2023-03-28 NOTE — Progress Notes (Unsigned)
Cardiology Office Note:  .   Date:  03/29/2023  ID:  [name], DOB [birth year], MRN [id number] PCP: Renford Dills, MD  [facility] Providers Cardiologist:  Verne Carrow, MD {  History of Present Illness: .   Shawn [name] is a 82 y.o. male with a past medical history of CKD stage III, gout, HTN, HLD and recent finding of cardiomyopathy who is here for follow-up visit.  Referred to cardiology from PCP for evaluation of LV systolic dysfunction.  Echocardiogram 01/25/2023 with LVEF 40 to 45% with global hypokinesis.  Mild AI.  He is on an ARB.  He was last seen February 09, 2023 by Dr. Clifton James.  Overall felt well at that time.  No chest pain or dyspnea.  No lower extremity edema.  No palpitations.  Today, he presents with a history of cardiac disease, presents for a follow-up visit after a recent cardiac catheterization. The catheterization revealed minimal nonobstructive disease with three different areas of 20% blockage. The patient reports no chest pain or other cardiac symptoms. He has been experiencing muscle aches, which he attributes to rosuvastatin, and has discontinued this medication. The patient also reports ongoing leg swelling, which he manages with diuretics. His weight has been gradually decreasing over time.  Reports no shortness of breath nor dyspnea on exertion. Reports no chest pain, pressure, or tightness. No edema, orthopnea, PND. Reports no palpitations.    Discussed the use of AI scribe software for clinical note transcription with the patient, who gave verbal consent to proceed.   ROS: Pertinent ROS in HPI  Studies Reviewed: .       Echo 02/27/23 Left Anterior Descending  Vessel is large.  Prox LAD lesion is 20% stenosed.  Mid LAD lesion is 20% stenosed.    Left Circumflex  Vessel is large.    Right Coronary Artery  Vessel is large.  Prox RCA to Dist RCA lesion is 20% stenosed. The lesion is calcified.    Intervention   No  interventions have been documented.   Coronary Diagrams  Diagnostic Dominance: Right  Intervention  Echo 01/25/23:  1. Left ventricular ejection fraction, by estimation, is 40 to 45%. The  left ventricle has mildly decreased function. The left ventricle  demonstrates global hypokinesis. Left ventricular diastolic parameters are  consistent with Grade I diastolic  dysfunction (impaired relaxation). The average left ventricular global  longitudinal strain is -14.4 %. The global longitudinal strain is  abnormal.   2. Right ventricular systolic function is normal. The right ventricular  size is normal.   3. The mitral valve is normal in structure. Trivial mitral valve  regurgitation. No evidence of mitral stenosis.   4. The aortic valve is calcified. There is moderate calcification of the  aortic valve. There is moderate thickening of the aortic valve. Aortic  valve regurgitation is mild. Aortic valve sclerosis/calcification is  present, without any evidence of aortic  stenosis.   5. The inferior vena cava is normal in size with greater than 50%  respiratory variability, suggesting right atrial pressure of 3 mmHg.          Physical Exam:   VS:  BP 120/60   Pulse 78   Ht 5\' 6"  (1.676 m)   Wt 204 lb (92.5 kg)   SpO2 94%   BMI 32.93 kg/m    Wt Readings from Last 3 Encounters:  03/29/23 204 lb (92.5 kg)  02/27/23 205 lb (93 kg)  02/09/23 207 lb 14.4 oz (94.3 kg)  GEN: Well nourished, well developed in no acute distress NECK: No JVD; No carotid bruits CARDIAC: RRR with PVCs, no murmurs, rubs, gallops RESPIRATORY:  Clear to auscultation without rales, wheezing or rhonchi  ABDOMEN: Soft, non-tender, non-distended EXTREMITIES:  No edema; No deformity   ASSESSMENT AND PLAN: .    Coronary Artery Disease Minimal nonobstructive disease in three different areas (20% blockage in right coronary artery, LAD). Discussed the importance of risk reduction to prevent further  progression of disease. -Start 81mg  aspirin daily (OTC). -Attempt to order Nexlizet (non-statin cholesterol medication) pending insurance approval. -Recheck lipid panel in March 2025.  Hyperlipidemia LDL 98 in August 2024, goal is under 70 due to mild coronary artery disease. Patient stopped taking rosuvastatin due to muscle cramps. -Discontinue rosuvastatin. -Attempt to order Nexlizet (non-statin cholesterol medication) pending insurance approval. -Recheck lipid panel in March 2025.  Lower Extremity Edema Chronic swelling, possibly due to fluid retention. Weight has been decreasing over time. -Continue current dose of Lasix. -Monitor for increased leg swelling or weight gain, which may necessitate an increase in Lasix dose.  Premature Ventricular Contractions (PVCs) Noted on auscultation, confirmed on prior EKG. No associated symptoms. -No immediate intervention needed.       Dispo: He can follow-up in 3 months  Signed, Sharlene Dory, PA-C

## 2023-03-29 ENCOUNTER — Encounter: Payer: Self-pay | Admitting: Physician Assistant

## 2023-03-29 ENCOUNTER — Ambulatory Visit: Payer: Medicare PPO | Attending: Physician Assistant | Admitting: Physician Assistant

## 2023-03-29 VITALS — BP 120/60 | HR 78 | Ht 66.0 in | Wt 204.0 lb

## 2023-03-29 DIAGNOSIS — I5032 Chronic diastolic (congestive) heart failure: Secondary | ICD-10-CM | POA: Diagnosis not present

## 2023-03-29 DIAGNOSIS — I2510 Atherosclerotic heart disease of native coronary artery without angina pectoris: Secondary | ICD-10-CM

## 2023-03-29 DIAGNOSIS — I493 Ventricular premature depolarization: Secondary | ICD-10-CM | POA: Diagnosis not present

## 2023-03-29 DIAGNOSIS — I420 Dilated cardiomyopathy: Secondary | ICD-10-CM | POA: Diagnosis not present

## 2023-03-29 DIAGNOSIS — R600 Localized edema: Secondary | ICD-10-CM | POA: Diagnosis not present

## 2023-03-29 MED ORDER — ASPIRIN 81 MG PO TBEC: 81.0000 mg | DELAYED_RELEASE_TABLET | Freq: Every day | ORAL | Status: AC

## 2023-03-29 MED ORDER — NEXLIZET 180-10 MG PO TABS: 1.0000 | ORAL_TABLET | Freq: Every day | ORAL | 3 refills | Status: DC

## 2023-03-29 NOTE — Patient Instructions (Addendum)
Medication Instructions:   START TAKING:  ASPIRIN 81 MG ONCE  A DAY   START TAKING: NEXLIZET  180-10  MG ONCE A DAY ( MEDICATION MAY NEED PRIOR AUTHORIZATION WITH PHARMACY)  STOP TAKING AND REMOVE THIS MEDICATION FROM YOUR MEDICATION LIST:  CRESTOR   *If you need a refill on your cardiac medications before your next appointment, please call your pharmacy*   Lab Work: RETURN IN 3 MONTHS LFT AND LIPIDS     If you have labs (blood work) drawn today and your tests are completely normal, you will receive your results only by: MyChart Message (if you have MyChart) OR A paper copy in the mail If you have any lab test that is abnormal or we need to change your treatment, we will call you to review the results.   Testing/Procedures: NONE ORDERED  TODAY     Follow-Up: At [facility], you and your health needs are our priority.  As part of our continuing mission to provide you with exceptional heart care, we have created designated Provider Care Teams.  These Care Teams include your primary Cardiologist (physician) and Advanced Practice Providers (APPs -  Physician Assistants and Nurse Practitioners) who all work together to provide you with the care you need, when you need it.  We recommend signing up for the patient portal called "MyChart".  Sign up information is provided on this After Visit Summary.  MyChart is used to connect with patients for Virtual Visits (Telemedicine).  Patients are able to view lab/test results, encounter notes, upcoming appointments, etc.  Non-urgent messages can be sent to your provider as well.   To learn more about what you can do with MyChart, go to [email or web address].    Your next appointment:   3 month(s)  Provider:   Verne Carrow, MD /APP   Other Instructions

## 2023-04-03 ENCOUNTER — Other Ambulatory Visit (HOSPITAL_COMMUNITY): Payer: Self-pay

## 2023-04-03 ENCOUNTER — Telehealth: Payer: Self-pay | Admitting: Pharmacy Technician

## 2023-04-03 NOTE — Telephone Encounter (Signed)
Pharmacy Patient Advocate Encounter   Received notification from Fax that prior authorization for nexlizet is required/requested.   Insurance verification completed.   The patient is insured through [employer] .   Per test claim: PA required; PA submitted to above mentioned insurance via CoverMyMeds Key/confirmation #/EOC [facility] Status is pending

## 2023-04-03 NOTE — Telephone Encounter (Signed)
Pharmacy Patient Advocate Encounter  Received notification from [facility] that Prior Authorization for nexlizet has been APPROVED from 04/17/22 to 04/16/24. Ran test claim, Copay is $80.00 3 months. This test claim was processed through [facility]- copay amounts may vary at other pharmacies due to pharmacy/plan contracts, or as the patient moves through the different stages of their insurance plan.   PA #/Case ID/Reference #: [id number]

## 2023-06-13 DIAGNOSIS — M25561 Pain in right knee: Secondary | ICD-10-CM | POA: Diagnosis not present

## 2023-06-14 DIAGNOSIS — Z0000 Encounter for general adult medical examination without abnormal findings: Secondary | ICD-10-CM | POA: Diagnosis not present

## 2023-06-14 DIAGNOSIS — N1831 Chronic kidney disease, stage 3a: Secondary | ICD-10-CM | POA: Diagnosis not present

## 2023-06-14 DIAGNOSIS — E291 Testicular hypofunction: Secondary | ICD-10-CM | POA: Diagnosis not present

## 2023-06-14 DIAGNOSIS — E1169 Type 2 diabetes mellitus with other specified complication: Secondary | ICD-10-CM | POA: Diagnosis not present

## 2023-06-14 DIAGNOSIS — I5022 Chronic systolic (congestive) heart failure: Secondary | ICD-10-CM | POA: Diagnosis not present

## 2023-06-14 DIAGNOSIS — E7800 Pure hypercholesterolemia, unspecified: Secondary | ICD-10-CM | POA: Diagnosis not present

## 2023-06-14 DIAGNOSIS — I10 Essential (primary) hypertension: Secondary | ICD-10-CM | POA: Diagnosis not present

## 2023-06-14 DIAGNOSIS — M109 Gout, unspecified: Secondary | ICD-10-CM | POA: Diagnosis not present

## 2023-07-01 NOTE — Progress Notes (Unsigned)
 Cardiology Office Note:  .   Date:  07/02/2023  ID:  Shawn [name], DOB Feb 03, 1941, MRN [id number] PCP: Renford Dills, MD  [facility] Providers Cardiologist:  Verne Carrow, MD {  History of Present Illness: .   Shawn [name] is a 83 y.o. male with a past medical history of CKD stage III, gout, HTN, HLD and recent finding of cardiomyopathy who is here for follow-up visit.  Referred to cardiology from PCP for evaluation of LV systolic dysfunction.  Echocardiogram 01/25/2023 with LVEF 40 to 45% with global hypokinesis.  Mild AI.  He is on an ARB.  He was last seen February 09, 2023 by Dr. Clifton James.  Overall felt well at that time.  No chest pain or dyspnea.  No lower extremity edema.  No palpitations.  He was seen by me December 2024,, he presents with a history of cardiac disease, presents for a follow-up visit after a recent cardiac catheterization. The catheterization revealed minimal nonobstructive disease with three different areas of 20% blockage. The patient reports no chest pain or other cardiac symptoms. He has been experiencing muscle aches, which he attributes to rosuvastatin, and has discontinued this medication. The patient also reports ongoing leg swelling, which he manages with diuretics. His weight has been gradually decreasing over time.  Reports no shortness of breath nor dyspnea on exertion. Reports no chest pain, pressure, or tightness. No edema, orthopnea, PND. Reports no palpitations.   Discussed the use of AI scribe software for clinical note transcription with the patient, who gave verbal consent to proceed.  Today, he presents, with a history of coronary artery disease and hyperlipidemia,  for follow-up. He reports that his insurance did not approve Nexlizet, a cholesterol-lowering medication. The patient's last LDL cholesterol level was 94, which is above the goal of less than 70 for patients with coronary artery disease. The patient also reports a  history of dizziness with a previous cholesterol medication, which resolved upon discontinuation.  In addition, the patient has been experiencing persistent edema despite taking Lasix 40mg  daily. He reports his weight has been fluctuating, but attributes this to dietary changes around the holidays. The patient denies any recent episodes of palpitations or chest pain. He also reports no recent episodes of lightheadedness or dizziness.  Reports no shortness of breath nor dyspnea on exertion. Reports no chest pain, pressure, or tightness. No edema, orthopnea, PND. Reports no palpitations.   Discussed the use of AI scribe software for clinical note transcription with the patient, who gave verbal consent to proceed.  ROS: Pertinent ROS in HPI  Studies Reviewed: .       Echo 02/27/23 Left Anterior Descending  Vessel is large.  Prox LAD lesion is 20% stenosed.  Mid LAD lesion is 20% stenosed.    Left Circumflex  Vessel is large.    Right Coronary Artery  Vessel is large.  Prox RCA to Dist RCA lesion is 20% stenosed. The lesion is calcified.    Intervention   No interventions have been documented.   Coronary Diagrams  Diagnostic Dominance: Right  Intervention  Echo 01/25/23:  1. Left ventricular ejection fraction, by estimation, is 40 to 45%. The  left ventricle has mildly decreased function. The left ventricle  demonstrates global hypokinesis. Left ventricular diastolic parameters are  consistent with Grade I diastolic  dysfunction (impaired relaxation). The average left ventricular global  longitudinal strain is -14.4 %. The global longitudinal strain is  abnormal.   2. Right ventricular systolic function is normal. The  right ventricular  size is normal.   3. The mitral valve is normal in structure. Trivial mitral valve  regurgitation. No evidence of mitral stenosis.   4. The aortic valve is calcified. There is moderate calcification of the  aortic valve. There is moderate  thickening of the aortic valve. Aortic  valve regurgitation is mild. Aortic valve sclerosis/calcification is  present, without any evidence of aortic  stenosis.   5. The inferior vena cava is normal in size with greater than 50%  respiratory variability, suggesting right atrial pressure of 3 mmHg.          Physical Exam:   VS:  BP 134/72   Pulse 73   Ht 5\' 6"  (1.676 m)   Wt 208 lb 6.4 oz (94.5 kg)   SpO2 96%   BMI 33.64 kg/m    Wt Readings from Last 3 Encounters:  07/02/23 208 lb 6.4 oz (94.5 kg)  03/29/23 204 lb (92.5 kg)  02/27/23 205 lb (93 kg)    GEN: Well nourished, well developed in no acute distress NECK: No JVD; No carotid bruits CARDIAC: RRR with PVCs, no murmurs, rubs, gallops RESPIRATORY:  Clear to auscultation without rales, wheezing or rhonchi  ABDOMEN: Soft, non-tender, non-distended EXTREMITIES:  No edema; No deformity   ASSESSMENT AND PLAN: .    Irregular Heartbeat Irregular heart rate detected, likely due to PVCs. No atrial fibrillation. Dizziness resolved after discontinuing metoprolol. - Review EKG results to determine presence of PVCs or other arrhythmia. -add metoprolol succinate 12.5mg  daily  Hyperlipidemia LDL at 94 mg/dL, above target <54 mg/dL. Insurance denied Nexlizet. Discussed Repatha, Praluent, and Leqvio as alternatives. Major side effect: injection site irritation. Target achievable with these options. - Provide literature on Repatha, Praluent, and Leqvio for review. - Offer referral to clinical pharmacist for medication guidance.  Peripheral Edema Persistent peripheral edema, slightly improved. On 40 mg Lasix daily. Weight fluctuations likely dietary. Renal function stable with creatinine at 1.4 mg/dL. - Continue Lasix 40 mg daily. - Monitor weight and dietary sodium intake.  Follow-up Recent lab work completed, kidney function results missing. Coordination needed to obtain results. - Contact [facility] office to obtain recent kidney  function lab results. - Request that [facility] office fax lab results for inclusion in chart.  Dispo: He can follow-up in 3-4 months  Signed, Sharlene Dory, PA-C

## 2023-07-02 ENCOUNTER — Ambulatory Visit: Payer: Medicare PPO | Attending: Physician Assistant | Admitting: Physician Assistant

## 2023-07-02 ENCOUNTER — Encounter: Payer: Self-pay | Admitting: Physician Assistant

## 2023-07-02 VITALS — BP 134/72 | HR 73 | Ht 66.0 in | Wt 208.4 lb

## 2023-07-02 DIAGNOSIS — R600 Localized edema: Secondary | ICD-10-CM

## 2023-07-02 DIAGNOSIS — E782 Mixed hyperlipidemia: Secondary | ICD-10-CM | POA: Diagnosis not present

## 2023-07-02 DIAGNOSIS — I2510 Atherosclerotic heart disease of native coronary artery without angina pectoris: Secondary | ICD-10-CM

## 2023-07-02 DIAGNOSIS — I493 Ventricular premature depolarization: Secondary | ICD-10-CM | POA: Diagnosis not present

## 2023-07-02 MED ORDER — METOPROLOL SUCCINATE ER 25 MG PO TB24: 12.5000 mg | ORAL_TABLET | Freq: Every day | ORAL | 3 refills | Status: DC

## 2023-07-02 NOTE — Patient Instructions (Signed)
 Medication Instructions:  Decrease Metoprolol Succinate (Toprol XL) to 12.5 mg once daily (1/2 tablet) *If you need a refill on your cardiac medications before your next appointment, please call your pharmacy*   Lab Work: NONE If you have labs (blood work) drawn today and your tests are completely normal, you will receive your results only by: MyChart Message (if you have MyChart) OR A paper copy in the mail If you have any lab test that is abnormal or we need to change your treatment, we will call you to review the results.   Testing/Procedures: NONE   Follow-Up: At [facility], you and your health needs are our priority.  As part of our continuing mission to provide you with exceptional heart care, we have created designated Provider Care Teams.  These Care Teams include your primary Cardiologist (physician) and Advanced Practice Providers (APPs -  Physician Assistants and Nurse Practitioners) who all work together to provide you with the care you need, when you need it.  We recommend signing up for the patient portal called "MyChart".  Sign up information is provided on this After Visit Summary.  MyChart is used to connect with patients for Virtual Visits (Telemedicine).  Patients are able to view lab/test results, encounter notes, upcoming appointments, etc.  Non-urgent messages can be sent to your provider as well.   To learn more about what you can do with MyChart, go to [email or web address].    Your next appointment:   3-4 months  Provider:   APP or Clifton James, MD  Other Instructions Track blood pressure for 2 weeks. Take BP 1 hours after taking medications. Send [place] the reading via MyChart.    1st Floor: - Lobby - Registration  - Pharmacy  - Lab - Cafe  2nd Floor: - PV Lab - Diagnostic Testing (echo, CT, nuclear med)  3rd Floor: - Vacant  4th Floor: - TCTS (cardiothoracic surgery) - AFib Clinic - Structural Heart Clinic - Vascular Surgery  -  Vascular Ultrasound  5th Floor: - HeartCare Cardiology (general and EP) - Clinical Pharmacy for coumadin, hypertension, lipid, weight-loss medications, and med management appointments    Valet parking services will be available as well.

## 2023-09-02 ENCOUNTER — Ambulatory Visit (HOSPITAL_COMMUNITY)
Admission: EM | Admit: 2023-09-02 | Discharge: 2023-09-02 | Disposition: A | Discharge status: Home / Self Care | Attending: Physician Assistant | Admitting: Physician Assistant

## 2023-09-02 ENCOUNTER — Encounter (HOSPITAL_COMMUNITY): Payer: Self-pay | Admitting: Emergency Medicine

## 2023-09-02 DIAGNOSIS — M109 Gout, unspecified: Secondary | ICD-10-CM

## 2023-09-02 MED ORDER — PREDNISONE 10 MG (21) PO TBPK: ORAL_TABLET | ORAL | 0 refills | Status: AC

## 2023-09-02 NOTE — Discharge Instructions (Addendum)
 We are treating you for a gout flare with prednisone.  Do not take any NSAIDs with this medication including aspirin , ibuprofen/Advil, naproxen/Aleve.  Use Tylenol  for additional pain relief.  Keep your foot elevated.  If you develop any additional symptoms including increasing pain, swelling, fever, nausea, vomiting you need to be seen immediately.  Your blood pressure was much better when we rechecked it.  Continue to monitor this as we discussed.  Follow-up with your primary care.

## 2023-09-02 NOTE — ED Triage Notes (Signed)
 Pt c/o right foot pain and swollen that started this morning. Pt states he has a flare up of his gout.

## 2023-09-02 NOTE — ED Provider Notes (Signed)
 [facility]    CSN: [id number] Arrival date & time: 09/02/23  1636      History   Chief Complaint Chief Complaint  Patient presents with   Foot Pain    HPI Shawn [name] is a 83 y.o. male.   Patient presents today with a 24-hour history of worsening right toe/foot pain.  Reports that pain is rated 8 on a 0 to pain scale, described as sharp, worse with palpation, no alleviating factors identified.  He does have a history of gout with similar presentation.  His last flare was several years ago.  He does report eating more shellfish recently and believes that this triggered his symptoms.  Yesterday he had some mild pain but when he woke up this morning it was significant and he has been unable to ambulate.  He is prescribed allopurinol and reports compliance with this medication.  He denies any recent medication changes.  He has tried Tylenol  without improvement.  He denies any injury or change in activity before symptoms began.  He denies any fever, nausea, vomiting.  He denies history of diabetes.    Past Medical History:  Diagnosis Date   Arthritis    Cardiac arrhythmia    Chronic kidney disease (CKD), stage III (moderate) (HCC)    Diverticulitis    DM (diabetes mellitus) (HCC)    Gout    High cholesterol    Hypercholesterolemia    Osteoarthritis of both knees    Peripheral edema    Testosterone  deficiency in male    Thoracic aortic ectasia [facility])     Patient Active Problem List   Diagnosis Date Noted   OSA (obstructive sleep apnea) 04/28/2014    Past Surgical History:  Procedure Laterality Date   KNEE ARTHROSCOPY     Right   REPLACEMENT TOTAL KNEE Bilateral    RIGHT/LEFT HEART CATH AND CORONARY ANGIOGRAPHY N/A 02/27/2023   Procedure: RIGHT/LEFT HEART CATH AND CORONARY ANGIOGRAPHY;  Surgeon: Odie Benne, MD;  Location: [facility];  Service: Cardiovascular;  Laterality: N/A;       Home Medications    Prior to Admission  medications   Medication Sig Start Date End Date Taking? Authorizing Provider  predniSONE (STERAPRED UNI-PAK 21 TAB) 10 MG (21) TBPK tablet As directed 09/02/23  Yes Jaelee Laughter K, PA-C  allopurinol (ZYLOPRIM) 300 MG tablet Take 150 mg by mouth daily.     [provider]  ANDROGEL  PUMP 20.25 MG/ACT (1.62%) GEL Apply 2 Pump topically daily. 04/03/18   [provider]  aspirin  EC 81 MG tablet Take 1 tablet (81 mg total) by mouth daily. Swallow whole. 03/29/23   Von Grumbling, PA-C  Bempedoic Acid-Ezetimibe (NEXLIZET ) 180-10 MG TABS Take 1 tablet by mouth daily. Patient not taking: Reported on 07/02/2023 03/29/23   Von Grumbling, PA-C  COMIRNATY syringe Inject 0.3 mLs into the muscle once. 03/19/23   [provider]  diclofenac Sodium (VOLTAREN) 1 % GEL Apply 1 Application topically 4 (four) times daily as needed (pain).    [provider]  FLUZONE HIGH-DOSE 0.5 ML injection Inject 0.5 mLs into the muscle once. 04/02/23   [provider]  furosemide (LASIX) 40 MG tablet Take 40 mg by mouth daily. 12/26/22   [provider]  irbesartan-hydrochlorothiazide (AVALIDE) 150-12.5 MG tablet Take 1 tablet by mouth daily. Patient not taking: Reported on 07/02/2023 06/03/15   [provider]  metoprolol  succinate (TOPROL  XL) 25 MG 24 hr tablet Take 0.5 tablets (  12.5 mg total) by mouth daily. 07/02/23   Von Grumbling, PA-C  ranitidine  (ZANTAC ) 150 MG tablet Take 1 tablet (150 mg total) by mouth 2 (two) times daily. Patient not taking: Reported on 09/06/2016 08/03/15 09/02/20  Kandy Orris, MD    Family History Family History  Problem Relation Age of Onset   Cancer Father        Lung cancer   Heart disease Brother     Social History Social History   Tobacco Use   Smoking status: Never   Smokeless tobacco: Never  Vaping Use   Vaping status: Never Used  Substance Use Topics   Alcohol use: Yes    Alcohol/week: 0.0 standard drinks of alcohol     Comment: 1-2 per week   Drug use: No     Allergies   Patient has no known allergies.   Review of Systems Review of Systems  Constitutional:  Positive for activity change. Negative for appetite change, fatigue and fever.  Eyes:  Negative for visual disturbance.  Respiratory:  Negative for cough and shortness of breath.   Cardiovascular:  Negative for chest pain.  Gastrointestinal:  Negative for abdominal pain, diarrhea, nausea and vomiting.  Musculoskeletal:  Positive for arthralgias and joint swelling. Negative for myalgias.  Skin:  Positive for color change. Negative for wound.  Neurological:  Negative for dizziness, weakness, light-headedness, numbness and headaches.     Physical Exam Triage Vital Signs ED Triage Vitals [09/02/23 1712]  Encounter Vitals Group     BP (!) 170/85     Systolic BP Percentile      Diastolic BP Percentile      Pulse Rate (!) 49     Resp 18     Temp 98.6 F (37 C)     Temp Source Oral     SpO2 96 %     Weight      Height      Head Circumference      Peak Flow      Pain Score 8     Pain Loc      Pain Education      Exclude from Growth Chart    No data found.  Updated Vital Signs BP 137/70 (BP Location: Right Arm)   Pulse (!) 46   Temp 98.6 F (37 C) (Oral)   Resp 18   SpO2 94%   Visual Acuity Right Eye Distance:   Left Eye Distance:   Bilateral Distance:    Right Eye Near:   Left Eye Near:    Bilateral Near:     Physical Exam Vitals reviewed.  Constitutional:      General: He is awake.     Appearance: Normal appearance. He is well-developed. He is not ill-appearing.     Comments: Very pleasant male appears stated age in no acute distress sitting comfortable in exam room  HENT:     Head: Normocephalic and atraumatic.     Mouth/Throat:     Pharynx: Uvula midline. No oropharyngeal exudate or posterior oropharyngeal erythema.  Cardiovascular:     Rate and Rhythm: Normal rate and regular rhythm.     Heart sounds:  Normal heart sounds, S1 normal and S2 normal. No murmur [name].    Comments: 2+ pitting edema to ankle on the right. Pulmonary:     Effort: Pulmonary effort is normal.     Breath sounds: Normal breath sounds. No stridor. No wheezing, rhonchi or rales.  Musculoskeletal:  Right foot: Normal range of motion. No deformity.  Feet:     Right foot:     Protective Sensation: 10 sites tested.  10 sites sensed.     Skin integrity: Erythema present.     Comments: Erythema, swelling, extreme tenderness palpation over right MTP joint with swelling throughout foot.  Foot is neurovascularly intact.  No wound or lesion noted. Neurological:     Mental Status: He is alert.  Psychiatric:        Behavior: Behavior is cooperative.      UC Treatments / Results  Labs (all labs ordered are listed, but only abnormal results are displayed) Labs Reviewed - No data to display  EKG   Radiology No results found.  Procedures Procedures (including critical care time)  Medications Ordered in UC Medications - No data to display  Initial Impression / Assessment and Plan / UC Course  I have reviewed the triage vital signs and the nursing notes.  Pertinent labs & imaging results that were available during my care of the patient were reviewed by me and considered in my medical decision making (see chart for details).     Patient is well-appearing, afebrile, nontoxic, nontachycardic.  Symptoms are consistent with previous gout flares.  Low suspicion for septic arthritis as patient is afebrile and well-appearing.  He has a history of chronic kidney disease and so was started on prednisone taper.  We discussed that he is not to take NSAIDs with this medication but can use Tylenol  for breakthrough pain.  He was encouraged to keep his leg elevated and avoid prolonged ambulation until symptoms improve.  He is to follow-up with his primary care for reevaluation.  Discussed that if anything worsens and he develops  increasing pain, fever, nausea/vomiting, numbness or paresthesias in his foot he needs to be seen emergently.  Strict return precautions given.  Patient initially had very elevated blood pressure and we discussed that he should monitor his blood pressure at home and keep log for evaluation at follow-up appointment with his primary care.  On recheck this had normalized but was encouraged to continue monitoring this at home and be evaluated with any concerning symptoms including chest pain, shortness of breath, headache, vision change, dizziness.  Patient expressed understanding and agreement with plan.  Final Clinical Impressions(s) / UC Diagnoses   Final diagnoses:  Acute gout involving toe of right foot, unspecified cause     Discharge Instructions      We are treating you for a gout flare with prednisone.  Do not take any NSAIDs with this medication including aspirin , ibuprofen/Advil, naproxen/Aleve.  Use Tylenol  for additional pain relief.  Keep your foot elevated.  If you develop any additional symptoms including increasing pain, swelling, fever, nausea, vomiting you need to be seen immediately.  Your blood pressure was much better when we rechecked it.  Continue to monitor this as we discussed.  Follow-up with your primary care.   ED Prescriptions     Medication Sig Dispense Auth. Provider   predniSONE (STERAPRED UNI-PAK 21 TAB) 10 MG (21) TBPK tablet As directed 21 tablet Lyndel Sarate K, PA-C      PDMP not reviewed this encounter.   Budd Cargo, PA-C 09/02/23 1846

## 2023-09-24 NOTE — Progress Notes (Signed)
 Cardiology Office Note:  .   Date:  10/08/2023  ID:  Charlie Simpers, DOB 09-23-1940, MRN [id number] PCP: Rexanne Ingle, MD  [facility] Providers Cardiologist:  Lonni Cash, MD    History of Present Illness: .   Shawn [name] is a 83 y.o. male  with a past medical history of CKD stage III, gout, HTN, HLD and recent finding of cardiomyopathy.   Referred to cardiology from PCP for evaluation of LV systolic dysfunction.  Echocardiogram 01/25/2023 with LVEF 40 to 45% with global hypokinesis.  Mild AI.  He is on an ARB.The catheterization revealed minimal nonobstructive disease with three different areas of 20% blockage.   Patient here for f/u. Was having trouble checking BP's at home and needs a new cuff. Daughter is a Engineer, civil (consulting) and BP 120/68. Denies chest pain, dyspnea. Had some swelling in feet due to gout. He push mows his grass and walks aroungd his yard a lot. Building a garden pen.   ROS:    Studies Reviewed: SABRA         Prior CV Studies:    Cath1/12/24    Prox RCA to Dist RCA lesion is 20% stenosed.   Prox LAD lesion is 20% stenosed.   Mid LAD lesion is 20% stenosed.   Mild non-obstructive CAD Mild elevation of right and left heart pressures (RA 13, RV 40/12/17, PA 39/18 mean 28, PCWP 18, LV 147/12/23, AO 143/73) Cardiac output 5.68 L/min, CI 2.8    Recommendations: Continue medical management of non-ischemic cardiomyopathy. Coronary Diagrams   Diagnostic Dominance: Right  Intervention   Echo 01/25/23:  1. Left ventricular ejection fraction, by estimation, is 40 to 45%. The  left ventricle has mildly decreased function. The left ventricle  demonstrates global hypokinesis. Left ventricular diastolic parameters are  consistent with Grade I diastolic  dysfunction (impaired relaxation). The average left ventricular global  longitudinal strain is -14.4 %. The global longitudinal strain is  abnormal.   2. Right ventricular systolic function is normal. The  right ventricular  size is normal.   3. The mitral valve is normal in structure. Trivial mitral valve  regurgitation. No evidence of mitral stenosis.   4. The aortic valve is calcified. There is moderate calcification of the  aortic valve. There is moderate thickening of the aortic valve. Aortic  valve regurgitation is mild. Aortic valve sclerosis/calcification is  present, without any evidence of aortic  stenosis.   5. The inferior vena cava is normal in size with greater than 50%  respiratory variability, suggesting right atrial pressure of 3 mmHg.     Risk Assessment/Calculations:             Physical Exam:   VS:  BP 124/68   Pulse 71   Ht 5' 6 (1.676 m)   Wt 204 lb 12.8 oz (92.9 kg)   SpO2 96%   BMI 33.06 kg/m    Wt Readings from Last 3 Encounters:  10/08/23 204 lb 12.8 oz (92.9 kg)  07/02/23 208 lb 6.4 oz (94.5 kg)  03/29/23 204 lb (92.5 kg)    GEN: Well nourished, well developed in no acute distress NECK: No JVD; No carotid bruits CARDIAC:  RRR, no murmurs, rubs, gallops RESPIRATORY:  Clear to auscultation without rales, wheezing or rhonchi  ABDOMEN: Soft, non-tender, non-distended EXTREMITIES:  No edema; No deformity   ASSESSMENT AND PLAN: .     CAD nonobstructive on cath-no chest pain  NICM EF 40-45% on echo 01/2023 -asymptomatic -continue irbesartan/CHTZ -lasix 40  mg daily -metoprolol  12.5 mg daily -check bmet/bnp today-last Crt 1.44 -lower ext edema-unchanged in 5 yrs unless he has gout -2gm sodium diet and compression hose  HTN-well controlled  HLD-nexlizet -had it checked by PCP in Feb  PVC's on low dose BB          Dispo: f/u in 6 months  Signed, Olivia Pavy, PA-C

## 2023-10-08 ENCOUNTER — Ambulatory Visit: Attending: Physician Assistant | Admitting: Physician Assistant

## 2023-10-08 ENCOUNTER — Encounter: Payer: Self-pay | Admitting: Physician Assistant

## 2023-10-08 VITALS — BP 124/68 | HR 71 | Ht 66.0 in | Wt 204.8 lb

## 2023-10-08 DIAGNOSIS — I10 Essential (primary) hypertension: Secondary | ICD-10-CM | POA: Diagnosis not present

## 2023-10-08 DIAGNOSIS — E7849 Other hyperlipidemia: Secondary | ICD-10-CM

## 2023-10-08 DIAGNOSIS — R600 Localized edema: Secondary | ICD-10-CM

## 2023-10-08 DIAGNOSIS — I2510 Atherosclerotic heart disease of native coronary artery without angina pectoris: Secondary | ICD-10-CM | POA: Diagnosis not present

## 2023-10-08 DIAGNOSIS — I420 Dilated cardiomyopathy: Secondary | ICD-10-CM

## 2023-10-08 NOTE — Patient Instructions (Signed)
 Medication Instructions:   Your physician recommends that you continue on your current medications as directed. Please refer to the Current Medication list given to you today.  *If you need a refill on your cardiac medications before your next appointment, please call your pharmacy*  Lab Work:  PLEASE GO DOWN STAIRS  [facility]  FIRST FLOOR   ( GET OFF ELEVATORS WALK TOWARDS WAITING AREA LAB LOCATED BY PHARMACY):  BMET AND BNP TODAY    If you have labs (blood work) drawn today and your tests are completely normal, you will receive your results only by: MyChart Message (if you have MyChart) OR A paper copy in the mail If you have any lab test that is abnormal or we need to change your treatment, we will call you to review the results.   Testing/Procedures: NONE ORDERED  TODAY    Follow-Up: At [facility], you and your health needs are our priority.  As part of our continuing mission to provide you with exceptional heart care, our providers are all part of one team.  This team includes your primary Cardiologist (physician) and Advanced Practice Providers or APPs (Physician Assistants and Nurse Practitioners) who all work together to provide you with the care you need, when you need it.  Your next appointment:    6 month(s)   Provider:  VERLIN    We recommend signing up for the patient portal called MyChart.  Sign up information is provided on this After Visit Summary.  MyChart is used to connect with patients for Virtual Visits (Telemedicine).  Patients are able to view lab/test results, encounter notes, upcoming appointments, etc.  Non-urgent messages can be sent to your provider as well.   To learn more about what you can do with MyChart, go to [email or web address].   Other Instructions     Low-Sodium Eating Plan Salt (sodium) helps you keep a healthy balance of fluids in your body. Too much sodium can raise your blood pressure. It can also cause fluid and waste to  be held in your body. Your health care provider or dietitian may recommend a low-sodium eating plan if you have high blood pressure (hypertension), kidney disease, liver disease, or heart failure. Eating less sodium can help lower your blood pressure and reduce swelling. It can also protect your heart, liver, and kidneys. What are tips for following this plan? Reading food labels  Check food labels for the amount of sodium per serving. If you eat more than one serving, you must multiply the listed amount by the number of servings. Choose foods with less than 140 milligrams (mg) of sodium per serving. Avoid foods with 300 mg of sodium or more per serving. Always check how much sodium is in a product, even if the label says unsalted or no salt added. Shopping  Buy products labeled as low-sodium or no salt added. Buy fresh foods. Avoid canned foods and pre-made or frozen meals. Avoid canned, cured, or processed meats. Buy breads that have less than 80 mg of sodium per slice. Cooking  Eat more home-cooked food. Try to eat less restaurant, buffet, and fast food. Try not to add salt when you cook. Use salt-free seasonings or herbs instead of table salt or sea salt. Check with your provider or pharmacist before using salt substitutes. Cook with plant-based oils, such as canola, sunflower, or olive oil. Meal planning When eating at a restaurant, ask if your food can be made with less salt or no salt. Avoid dishes  labeled as brined, pickled, cured, or smoked. Avoid dishes made with soy sauce, miso, or teriyaki sauce. Avoid foods that have monosodium glutamate (MSG) in them. MSG may be added to some restaurant food, sauces, soups, bouillon, and canned foods. Make meals that can be grilled, baked, poached, roasted, or steamed. These are often made with less sodium. General information Try to limit your sodium intake to 1,500-2,300 mg each day, or the amount told by your provider. What foods  should I eat? Fruits Fresh, frozen, or canned fruit. Fruit juice. Vegetables Fresh or frozen vegetables. No salt added canned vegetables. No salt added tomato sauce and paste. Low-sodium or reduced-sodium tomato and vegetable juice. Grains Low-sodium cereals, such as oats, puffed wheat and rice, and shredded wheat. Low-sodium crackers. Unsalted rice. Unsalted pasta. Low-sodium bread. Whole grain breads and whole grain pasta. Meats and other proteins Fresh or frozen meat, poultry, seafood, and fish. These should have no added salt. Low-sodium canned tuna and salmon. Unsalted nuts. Dried peas, beans, and lentils without added salt. Unsalted canned beans. Eggs. Unsalted nut butters. Dairy Milk. Soy milk. Cheese that is naturally low in sodium, such as ricotta cheese, fresh mozzarella, or Swiss cheese. Low-sodium or reduced-sodium cheese. Cream cheese. Yogurt. Seasonings and condiments Fresh and dried herbs and spices. Salt-free seasonings. Low-sodium mustard and ketchup. Sodium-free salad dressing. Sodium-free light mayonnaise. Fresh or refrigerated horseradish. Lemon juice. Vinegar. Other foods Homemade, reduced-sodium, or low-sodium soups. Unsalted popcorn and pretzels. Low-salt or salt-free chips. The items listed above may not be all the foods and drinks you can have. Talk to a dietitian to learn more. What foods should I avoid? Vegetables Sauerkraut, pickled vegetables, and relishes. Olives. [place] fries. Onion rings. Regular canned vegetables, except low-sodium or reduced-sodium items. Regular canned tomato sauce and paste. Regular tomato and vegetable juice. Frozen vegetables in sauces. Grains Instant hot cereals. Bread stuffing, pancake, and biscuit mixes. Croutons. Seasoned rice or pasta mixes. Noodle soup cups. Boxed or frozen macaroni and cheese. Regular salted crackers. Self-rising flour. Meats and other proteins Meat or fish that is salted, canned, smoked, spiced, or pickled.  Precooked or cured meat, such as sausages or meat loaves. [place]. Ham. Pepperoni. Hot dogs. Corned beef. Chipped beef. Salt pork. Jerky. Pickled herring, anchovies, and sardines. Regular canned tuna. Salted nuts. Dairy Processed cheese and cheese spreads. Hard cheeses. Cheese curds. Blue cheese. Feta cheese. String cheese. Regular cottage cheese. Buttermilk. Canned milk. Fats and oils Salted butter. Regular margarine. Ghee. Bacon fat. Seasonings and condiments Onion salt, garlic salt, seasoned salt, table salt, and sea salt. Canned and packaged gravies. Worcestershire sauce. Tartar sauce. Barbecue sauce. Teriyaki sauce. Soy sauce, including reduced-sodium soy sauce. Steak sauce. Fish sauce. Oyster sauce. Cocktail sauce. Horseradish that you find on the shelf. Regular ketchup and mustard. Meat flavorings and tenderizers. Bouillon cubes. Hot sauce. Pre-made or packaged marinades. Pre-made or packaged taco seasonings. Relishes. Regular salad dressings. Salsa. Other foods Salted popcorn and pretzels. Corn chips and puffs. Potato and tortilla chips. Canned or dried soups. Pizza. Frozen entrees and pot pies. The items listed above may not be all the foods and drinks you should avoid. Talk to a dietitian to learn more. This information is not intended to replace advice given to you by your health care provider. Make sure you discuss any questions you have with your health care provider. Document Revised: 04/20/2022 Document Reviewed: 04/20/2022 Elsevier Patient Education  2024 [employer].

## 2024-02-29 ENCOUNTER — Other Ambulatory Visit: Payer: Self-pay | Admitting: Physician Assistant

## 2024-03-03 MED ORDER — METOPROLOL SUCCINATE ER 25 MG PO TB24: 12.5000 mg | ORAL_TABLET | Freq: Every day | ORAL | 2 refills | Status: AC

## 2024-03-27 ENCOUNTER — Telehealth: Payer: Self-pay | Admitting: Pharmacy Technician

## 2024-03-27 NOTE — Telephone Encounter (Signed)
° °  Pharmacy Patient Advocate Encounter   Received notification from CoverMyMeds that prior authorization for nexlizet  is required/requested.   Insurance verification completed.   The patient is insured through [employer].   Per pa

## 2024-03-31 ENCOUNTER — Other Ambulatory Visit: Payer: Self-pay

## 2024-04-01 ENCOUNTER — Other Ambulatory Visit: Payer: Self-pay | Admitting: Physician Assistant

## 2024-04-02 MED ORDER — NEXLIZET 180-10 MG PO TABS: 1.0000 | ORAL_TABLET | Freq: Every day | ORAL | 1 refills | Status: AC

## 2024-05-21 ENCOUNTER — Ambulatory Visit: Admitting: Cardiovascular Disease
